# Patient Record
Sex: Male | Born: 1975 | Race: White | Hispanic: No | Smoking: Former smoker
Health system: Southern US, Community
[De-identification: ages and names within clinical notes are randomized; demographics above are authoritative.]

## PROBLEM LIST (undated history)

## (undated) DIAGNOSIS — F988 Other specified behavioral and emotional disorders with onset usually occurring in childhood and adolescence: Secondary | ICD-10-CM

## (undated) DIAGNOSIS — M1990 Unspecified osteoarthritis, unspecified site: Secondary | ICD-10-CM

## (undated) DIAGNOSIS — G4730 Sleep apnea, unspecified: Secondary | ICD-10-CM

## (undated) DIAGNOSIS — M549 Dorsalgia, unspecified: Secondary | ICD-10-CM

## (undated) DIAGNOSIS — E739 Lactose intolerance, unspecified: Secondary | ICD-10-CM

## (undated) DIAGNOSIS — T7840XA Allergy, unspecified, initial encounter: Secondary | ICD-10-CM

## (undated) DIAGNOSIS — R7303 Prediabetes: Secondary | ICD-10-CM

## (undated) DIAGNOSIS — M2550 Pain in unspecified joint: Secondary | ICD-10-CM

## (undated) DIAGNOSIS — R002 Palpitations: Secondary | ICD-10-CM

## (undated) DIAGNOSIS — K5900 Constipation, unspecified: Secondary | ICD-10-CM

## (undated) DIAGNOSIS — R112 Nausea with vomiting, unspecified: Secondary | ICD-10-CM

## (undated) DIAGNOSIS — K760 Fatty (change of) liver, not elsewhere classified: Secondary | ICD-10-CM

## (undated) DIAGNOSIS — K219 Gastro-esophageal reflux disease without esophagitis: Secondary | ICD-10-CM

## (undated) DIAGNOSIS — Z9889 Other specified postprocedural states: Secondary | ICD-10-CM

## (undated) DIAGNOSIS — M5136 Other intervertebral disc degeneration, lumbar region: Secondary | ICD-10-CM

## (undated) DIAGNOSIS — M51369 Other intervertebral disc degeneration, lumbar region without mention of lumbar back pain or lower extremity pain: Secondary | ICD-10-CM

## (undated) HISTORY — DX: Gastro-esophageal reflux disease without esophagitis: K21.9

## (undated) HISTORY — DX: Fatty (change of) liver, not elsewhere classified: K76.0

## (undated) HISTORY — DX: Other intervertebral disc degeneration, lumbar region without mention of lumbar back pain or lower extremity pain: M51.369

## (undated) HISTORY — DX: Unspecified osteoarthritis, unspecified site: M19.90

## (undated) HISTORY — PX: WISDOM TOOTH EXTRACTION: SHX21

## (undated) HISTORY — DX: Lactose intolerance, unspecified: E73.9

## (undated) HISTORY — DX: Other intervertebral disc degeneration, lumbar region: M51.36

## (undated) HISTORY — DX: Other specified behavioral and emotional disorders with onset usually occurring in childhood and adolescence: F98.8

## (undated) HISTORY — DX: Allergy, unspecified, initial encounter: T78.40XA

## (undated) HISTORY — PX: TONSILLECTOMY: SUR1361

## (undated) HISTORY — DX: Sleep apnea, unspecified: G47.30

## (undated) HISTORY — PX: ROTATOR CUFF REPAIR: SHX139

## (undated) HISTORY — DX: Palpitations: R00.2

## (undated) HISTORY — DX: Dorsalgia, unspecified: M54.9

## (undated) HISTORY — DX: Prediabetes: R73.03

## (undated) HISTORY — DX: Pain in unspecified joint: M25.50

## (undated) HISTORY — DX: Constipation, unspecified: K59.00

---

## 2008-03-12 HISTORY — PX: ROTATOR CUFF REPAIR: SHX139

## 2013-10-26 DIAGNOSIS — M545 Low back pain, unspecified: Secondary | ICD-10-CM | POA: Insufficient documentation

## 2015-10-07 ENCOUNTER — Encounter: Payer: Self-pay | Admitting: *Deleted

## 2015-10-07 ENCOUNTER — Emergency Department
Admission: EM | Admit: 2015-10-07 | Discharge: 2015-10-07 | Disposition: A | Discharge status: Home / Self Care | Payer: Self-pay | Source: Home / Self Care | Attending: Family Medicine | Admitting: Family Medicine

## 2015-10-07 DIAGNOSIS — S39012A Strain of muscle, fascia and tendon of lower back, initial encounter: Secondary | ICD-10-CM

## 2015-10-07 MED ORDER — IBUPROFEN 600 MG PO TABS
600.0000 mg | ORAL_TABLET | Freq: Once | ORAL | Status: AC
  Administered 2015-10-07: 600 mg via ORAL

## 2015-10-07 MED ORDER — MELOXICAM 7.5 MG PO TABS: 7.5000 mg | ORAL_TABLET | Freq: Every day | ORAL | 0 refills | Status: DC

## 2015-10-07 MED ORDER — CYCLOBENZAPRINE HCL 10 MG PO TABS: 10.0000 mg | ORAL_TABLET | Freq: Two times a day (BID) | ORAL | 0 refills | Status: DC | PRN

## 2015-10-07 NOTE — ED Provider Notes (Signed)
CSN: [id number]     Arrival date & time 10/07/15  1225 History   None    Chief Complaint  Patient presents with  . Back Pain   (Consider location/radiation/quality/duration/timing/severity/associated sxs/prior Treatment) HPI Douglas Fenstermaker Sr. is a 40 y.o. male presenting to UC with c/o lower back pain that has been persistent for about 2 days. Pt notes it started after he awkwardly stepped out of his truck, twisting to catch his phone that had fallen out of his lap. Pain is aching and sore, 4/10. Worse with certain movements and positions. He has been taking acetaminophen and did take 1 of his mother's flexerils last night with significant relief. Hx of back strain in the past. Pain does not radiate into legs. Denies numbness or tingling in legs or groin. Denies urinary symptoms. Denies fever or chills.    History reviewed. No pertinent past medical history. History reviewed. No pertinent surgical history. Family History  Problem Relation Age of Onset  . Hyperlipidemia Mother   . Hypertension Mother   . Diabetes Mother   . Hypertension Father   . Hyperlipidemia Father   . Diabetes Father    Social History  Substance Use Topics  . Smoking status: Former Games developer  . Smokeless tobacco: Never Used  . Alcohol use No    Review of Systems  Constitutional: Negative for chills and fever.  Gastrointestinal: Negative for nausea and vomiting.  Genitourinary: Negative for dysuria, flank pain, frequency and hematuria.  Musculoskeletal: Positive for back pain and myalgias. Negative for arthralgias, gait problem and joint swelling.  Skin: Negative for color change, rash and wound.  Neurological: Negative for weakness and numbness.    Allergies  Review of patient's allergies indicates no known allergies.  Home Medications   Prior to Admission medications   Medication Sig Start Date End Date Taking? Authorizing Provider  cyclobenzaprine (FLEXERIL) 10 MG tablet Take 1 tablet (10 mg total)  by mouth 2 (two) times daily as needed for muscle spasms. 10/07/15   Junius Finner, PA-C  meloxicam (MOBIC) 7.5 MG tablet Take 1-2 tablets (7.5-15 mg total) by mouth daily. For 5-7 days, then daily as needed for pain 10/07/15   Junius Finner, PA-C   Meds Ordered and Administered this Visit   Medications  ibuprofen (ADVIL,MOTRIN) tablet 600 mg (600 mg Oral Given 10/07/15 1252)    BP 136/86 (BP Location: Left Arm)   Pulse (!) 59   Temp 98.3 F (36.8 C) (Oral)   Ht 5\' 11"  (1.803 m)   Wt (!) 365 lb (165.6 kg)   SpO2 96%   BMI 50.91 kg/m  No data found.   Physical Exam  Constitutional: He is oriented to person, place, and time. He appears well-developed and well-nourished.  Sitting comfortably in exam chair, NAD  HENT:  Head: Normocephalic and atraumatic.  Neck: Normal range of motion. Neck supple.  No midline spinal tenderness.  Cardiovascular: Normal rate.   Pulmonary/Chest: Effort normal.  Musculoskeletal: Normal range of motion. He exhibits tenderness. He exhibits no edema or deformity.  No midline spinal tenderness. Tenderness to bilateral lower lumbar muscles.  Full ROM upper and lower extremities with 5/5 strength bilaterally.  Negative straight leg raise.  Neurological: He is alert and oriented to person, place, and time.  Skin: Skin is warm and dry. No rash noted. No erythema.  Psychiatric: He has a normal mood and affect. His behavior is normal.  Nursing note and vitals reviewed.   Urgent Care Course   Clinical Course  Procedures (including critical care time)  Labs Review Labs Reviewed - No data to display  Imaging Review No results found.   MDM   1. Low back strain, initial encounter    Pt c/o lower back pain from twisting type injury.  No red flag symptoms. No indication for imaging at this time.   Rx: flexeril and meloxicam  Home care instructions provided. Encouraged f/u with PCP or Sports Medicine in 1-2 weeks if not improving. Patient verbalized  understanding and agreement with treatment plan. Pt unsure if he wants to go back to work tomorrow as he wants to work but does not want to exacerbate pain. Pt is off on Sunday. Two work notes provided, one stating pt can go back tomorrow, 10/08/15, the other stating he can go back Monday, 10/10/15.    Junius Finner, PA-C 10/07/15 1340

## 2015-10-07 NOTE — Discharge Instructions (Signed)
°  Meloxicam (Mobic) is an antiinflammatory to help with pain and inflammation.  Do not take ibuprofen, Advil, Aleve, or any other medications that contain NSAIDs while taking meloxicam as this may cause stomach upset or even ulcers if taken in large amounts for an extended period of time.   Flexeril is a muscle relaxer and may cause drowsiness. Do not drink alcohol, drive, or operate heavy machinery while taking.  Please review information packets at the end of your paperwork for stretches and exercises to help relief and prevent your back pain.

## 2015-10-07 NOTE — ED Triage Notes (Signed)
Pt reports feeling a pull in his lower back when stepping our of his truck 2 days ago. He c/o low back pain ever since, radiates down back of his legs at times. Used warm compress @ home and 1 muscle relaxer with relief.

## 2018-08-21 ENCOUNTER — Telehealth: Payer: Self-pay | Admitting: *Deleted

## 2018-09-09 NOTE — Telephone Encounter (Signed)
Unable to reach pt

## 2018-12-01 ENCOUNTER — Other Ambulatory Visit: Payer: Self-pay

## 2018-12-01 ENCOUNTER — Telehealth: Payer: Self-pay | Admitting: Family Medicine

## 2018-12-02 ENCOUNTER — Encounter: Payer: Self-pay | Admitting: Family Medicine

## 2018-12-02 ENCOUNTER — Ambulatory Visit (INDEPENDENT_AMBULATORY_CARE_PROVIDER_SITE_OTHER): Payer: Self-pay

## 2018-12-02 ENCOUNTER — Ambulatory Visit (INDEPENDENT_AMBULATORY_CARE_PROVIDER_SITE_OTHER): Payer: Self-pay | Admitting: Family Medicine

## 2018-12-02 VITALS — BP 116/63 | HR 56 | Temp 98.7°F | Resp 20 | Ht 71.0 in | Wt >= 6400 oz

## 2018-12-02 DIAGNOSIS — M25562 Pain in left knee: Secondary | ICD-10-CM

## 2018-12-02 DIAGNOSIS — R002 Palpitations: Secondary | ICD-10-CM

## 2018-12-02 DIAGNOSIS — M549 Dorsalgia, unspecified: Secondary | ICD-10-CM

## 2018-12-02 DIAGNOSIS — R350 Frequency of micturition: Secondary | ICD-10-CM

## 2018-12-02 DIAGNOSIS — M25551 Pain in right hip: Secondary | ICD-10-CM

## 2018-12-02 DIAGNOSIS — M25552 Pain in left hip: Secondary | ICD-10-CM

## 2018-12-02 DIAGNOSIS — M25561 Pain in right knee: Secondary | ICD-10-CM

## 2018-12-02 DIAGNOSIS — R5383 Other fatigue: Secondary | ICD-10-CM

## 2018-12-02 DIAGNOSIS — R001 Bradycardia, unspecified: Secondary | ICD-10-CM

## 2018-12-02 DIAGNOSIS — Z0000 Encounter for general adult medical examination without abnormal findings: Secondary | ICD-10-CM

## 2018-12-02 LAB — BAYER DCA HB A1C WAIVED: HB A1C (BAYER DCA - WAIVED): 5.6 % (ref <7.0)

## 2018-12-02 NOTE — Patient Instructions (Signed)
For your weight lifting workout I recommend L-Arginine and/or conjugated linoleic Acid (CLA)

## 2018-12-02 NOTE — Progress Notes (Signed)
Subjective:  Patient ID: Douglas Poche Sr., male    DOB: [birth year]  Age: 43 y.o. MRN: [id number]  CC: Establish Care (NEW pt )   HPI Douglas Wegner Sr. presents for concerns for diabetes due to strong family history and feeling lethargic as well as having urinary frequency. He is obese and wants to know if there is a medical cause because he has been on many diets and only gained more weight. He admits having a bad sweet tooth.  . The patient has a concern for hypothyroidism.  Pt. denies any change in  voice, loss of hair, but has cold intolerance. Energy level has been poor. Patient denies constipation and diarrhea. No myxedema.   Additionally pt. Reports moderately severe pain in the lower back, hips and knees. He has had cortisone injections in the past with fair to good relief. AThe pain is increased with activity. The more strenuous the worse it gets.   HE has palptitations reported as heart skipping beats then trying to catch up by going faster.  Depression screen [facility] 2/9 12/02/2018  Decreased Interest 0  Down, Depressed, Hopeless 0  PHQ - 2 Score 0    History Douglas Branch has a past medical history of Allergy, Arthritis, DDD (degenerative disc disease), lumbar, GERD (gastroesophageal reflux disease), and Sleep apnea.   He has a past surgical history that includes Rotator cuff repair (Left); Tonsillectomy; and Wisdom tooth extraction.   His family history includes Diabetes in his father, maternal grandfather, maternal grandmother, mother, paternal grandfather, and paternal grandmother; Emphysema in his paternal grandmother; Gout in his paternal uncle; Hyperlipidemia in his father, maternal grandfather, maternal grandmother, mother, paternal grandfather, and paternal grandmother; Hypertension in his father, maternal grandfather, maternal grandmother, mother, paternal grandfather, and paternal grandmother.He reports that he has quit smoking. He has never used smokeless tobacco. He  reports that he does not drink alcohol or use drugs.    ROS Review of Systems  Constitutional: Negative.  Negative for activity change, fatigue and unexpected weight change.  HENT: Negative.  Negative for congestion, ear pain, hearing loss, postnasal drip and trouble swallowing.   Eyes: Negative for pain and visual disturbance.  Respiratory: Negative for cough, chest tightness and shortness of breath.   Cardiovascular: Negative for chest pain, palpitations and leg swelling.  Gastrointestinal: Negative for abdominal distention, abdominal pain, blood in stool, constipation, diarrhea, nausea and vomiting.  Endocrine: Negative for cold intolerance, heat intolerance and polydipsia.  Genitourinary: Positive for difficulty urinating, frequency, scrotal swelling, testicular pain and urgency. Negative for dysuria and flank pain.  Musculoskeletal: Positive for arthralgias and back pain. Negative for joint swelling and myalgias.  Skin: Negative for color change, rash and wound.  Neurological: Negative for dizziness, syncope, speech difficulty, weakness, light-headedness, numbness and headaches.  Hematological: Does not bruise/bleed easily.  Psychiatric/Behavioral: Negative for confusion, decreased concentration, dysphoric mood and sleep disturbance. The patient is not nervous/anxious.     Objective:  BP 116/63    Pulse (!) 56    Temp 98.7 F (37.1 C)    Resp 20    Ht _0  (1.803 m)    Wt (!) 406 lb (184.2 kg)    SpO2 96%    BMI 56.63 kg/m   BP Readings from Last 3 Encounters:  12/02/18 116/63  10/07/15 136/86    Wt Readings from Last 3 Encounters:  12/02/18 (!) 406 lb (184.2 kg)  10/07/15 (!) 365 lb (165.6 kg)     Physical Exam Constitutional:  General: He is not in acute distress.    Appearance: He is well-developed. He is obese.  HENT:     Head: Normocephalic and atraumatic.     Right Ear: External ear normal.     Left Ear: External ear normal.     Nose: Nose normal.  Eyes:      Conjunctiva/sclera: Conjunctivae normal.     Pupils: Pupils are equal, round, and reactive to light.  Neck:     Musculoskeletal: Normal range of motion and neck supple.     Thyroid: No thyromegaly.     Trachea: No tracheal deviation.  Cardiovascular:     Rate and Rhythm: Normal rate and regular rhythm.     Heart sounds: Normal heart sounds. No murmur. No friction rub. No gallop.   Pulmonary:     Effort: Pulmonary effort is normal. No respiratory distress.     Breath sounds: Normal breath sounds. No wheezing or rales.  Abdominal:     General: Bowel sounds are normal. There is no distension.     Palpations: Abdomen is soft. There is no mass.     Tenderness: There is no abdominal tenderness.     Hernia: There is no hernia in the left inguinal area.  Genitourinary:    Penis: Normal.      Scrotum/Testes: Normal.  Musculoskeletal: Normal range of motion.  Lymphadenopathy:     Cervical: No cervical adenopathy.  Skin:    General: Skin is warm and dry.  Neurological:     Mental Status: He is alert and oriented to person, place, and time.     Deep Tendon Reflexes: Reflexes are normal and symmetric.  Psychiatric:        Behavior: Behavior normal.        Thought Content: Thought content normal.        Judgment: Judgment normal.       Assessment & Plan:   Douglas Branch was seen today for establish care.  Diagnoses and all orders for this visit:  Palpitations -     Bayer DCA Hb A1c Waived -     CBC with Differential/Platelet -     CMP14+EGFR -     EKG 12-Lead -     Lipid panel -     TSH + free T4 -     Urinalysis, Complete -     Urine Culture -     VITAMIN D 25 Hydroxy (Vit-D Deficiency, Fractures) -     Testosterone,Free and Total -     Cortisol  Frequent urination -     Bayer DCA Hb A1c Waived -     CBC with Differential/Platelet -     CMP14+EGFR -     Urinalysis, Complete -     Urine Culture -     VITAMIN D 25 Hydroxy (Vit-D Deficiency, Fractures) -      Testosterone,Free and Total -     Cortisol  Fatigue, unspecified type -     Bayer DCA Hb A1c Waived -     CBC with Differential/Platelet -     CMP14+EGFR -     Lipid panel -     TSH + free T4 -     VITAMIN D 25 Hydroxy (Vit-D Deficiency, Fractures) -     Testosterone,Free and Total -     Cortisol -     CYCLIC CITRUL PEPTIDE ANTIBODY, IGG/IGA -     Arthritis Panel -     Anti-DNA antibody, double-stranded -  HLA-B27 antigen -     Sedimentation rate  Well adult exam -     CBC with Differential/Platelet -     CMP14+EGFR -     Lipid panel -     VITAMIN D 25 Hydroxy (Vit-D Deficiency, Fractures) -     Testosterone,Free and Total -     Cortisol  Morbid obesity (HCC) -     Bayer DCA Hb A1c Waived -     CBC with Differential/Platelet -     CMP14+EGFR -     TSH + free T4 -     VITAMIN D 25 Hydroxy (Vit-D Deficiency, Fractures) -     Testosterone,Free and Total -     Cortisol -     CYCLIC CITRUL PEPTIDE ANTIBODY, IGG/IGA -     Arthritis Panel -     Anti-DNA antibody, double-stranded -     HLA-B27 antigen -     Sedimentation rate  Arthralgia of back -     CBC with Differential/Platelet -     CMP14+EGFR -     VITAMIN D 25 Hydroxy (Vit-D Deficiency, Fractures) -     DG Lumbar Spine 2-3 Views; Future -     Testosterone,Free and Total -     Cortisol -     CYCLIC CITRUL PEPTIDE ANTIBODY, IGG/IGA -     Arthritis Panel -     Anti-DNA antibody, double-stranded -     HLA-B27 antigen -     Sedimentation rate -     Ambulatory referral to Orthopedics  Arthralgia of both knees -     CBC with Differential/Platelet -     CMP14+EGFR -     VITAMIN D 25 Hydroxy (Vit-D Deficiency, Fractures) -     Cancel: DG Knee 1-2 Views Right; Future -     Testosterone,Free and Total -     Cortisol -     CYCLIC CITRUL PEPTIDE ANTIBODY, IGG/IGA -     Arthritis Panel -     Anti-DNA antibody, double-stranded -     HLA-B27 antigen -     Sedimentation rate -     DG Knee 1-2 Views Left; Future -      Ambulatory referral to Orthopedics  Pain of both hip joints -     CBC with Differential/Platelet -     CMP14+EGFR -     VITAMIN D 25 Hydroxy (Vit-D Deficiency, Fractures) -     DG Pelvis 1-2 Views; Future -     Testosterone,Free and Total -     Cortisol -     CYCLIC CITRUL PEPTIDE ANTIBODY, IGG/IGA -     Arthritis Panel -     Anti-DNA antibody, double-stranded -     HLA-B27 antigen -     Sedimentation rate -     Ambulatory referral to Orthopedics  Bradycardia by electrocardiogram -     TSH + free T4 -     Ambulatory referral to Cardiology       I have discontinued Remo Lipps Sabree Sr.'s meloxicam and cyclobenzaprine. I am also having him maintain his multivitamin with minerals and glucosamine-chondroitin.  Allergies as of 12/02/2018      Reactions   Pollen Extract Itching   Watery eyes      Medication List       Accurate as of December 02, 2018  5:40 PM. If you have any questions, ask your nurse or doctor.        STOP taking these medications  cyclobenzaprine 10 MG tablet Commonly known as: FLEXERIL Stopped by: Claretta Fraise, MD   meloxicam 7.5 MG tablet Commonly known as: Mobic Stopped by: Claretta Fraise, MD     TAKE these medications   glucosamine-chondroitin 500-400 MG tablet Take 2 tablets by mouth daily.   multivitamin with minerals Tabs tablet Take 1 tablet by mouth daily.        Follow-up: Return in about 6 weeks (around 01/13/2019).  Claretta Fraise, M.D.

## 2018-12-03 ENCOUNTER — Other Ambulatory Visit: Payer: Self-pay

## 2018-12-04 ENCOUNTER — Telehealth: Payer: Self-pay | Admitting: Family Medicine

## 2018-12-05 LAB — CMP14+EGFR
ALT: 44 IU/L (ref 0–44)
AST: 59 IU/L — ABNORMAL HIGH (ref 0–40)
Albumin/Globulin Ratio: 1.3 (ref 1.2–2.2)
Albumin: 4 g/dL (ref 4.0–5.0)
Alkaline Phosphatase: 97 IU/L (ref 39–117)
BUN/Creatinine Ratio: 12 (ref 9–20)
BUN: 11 mg/dL (ref 6–24)
Bilirubin Total: 0.8 mg/dL (ref 0.0–1.2)
CO2: 24 mmol/L (ref 20–29)
Calcium: 9.3 mg/dL (ref 8.7–10.2)
Chloride: 103 mmol/L (ref 96–106)
Creatinine, Ser: 0.9 mg/dL (ref 0.76–1.27)
GFR calc Af Amer: 121 mL/min/{1.73_m2} (ref >59)
GFR calc non Af Amer: 104 mL/min/{1.73_m2} (ref >59)
Globulin, Total: 3.2 g/dL (ref 1.5–4.5)
Glucose: 98 mg/dL (ref 65–99)
Potassium: 4.3 mmol/L (ref 3.5–5.2)
Sodium: 140 mmol/L (ref 134–144)
Total Protein: 7.2 g/dL (ref 6.0–8.5)

## 2018-12-05 LAB — CBC WITH DIFFERENTIAL/PLATELET
Basophils Absolute: 0 10*3/uL (ref 0.0–0.2)
Basos: 0 %
EOS (ABSOLUTE): 0.2 10*3/uL (ref 0.0–0.4)
Eos: 3 %
Hematocrit: 40.2 % (ref 37.5–51.0)
Hemoglobin: 13.9 g/dL (ref 13.0–17.7)
Immature Grans (Abs): 0 10*3/uL (ref 0.0–0.1)
Immature Granulocytes: 0 %
Lymphocytes Absolute: 1.7 10*3/uL (ref 0.7–3.1)
Lymphs: 31 %
MCH: 32.6 pg (ref 26.6–33.0)
MCHC: 34.6 g/dL (ref 31.5–35.7)
MCV: 94 fL (ref 79–97)
Monocytes Absolute: 0.6 10*3/uL (ref 0.1–0.9)
Monocytes: 11 %
Neutrophils Absolute: 3 10*3/uL (ref 1.4–7.0)
Neutrophils: 55 %
Platelets: 107 10*3/uL — ABNORMAL LOW (ref 150–450)
RBC: 4.26 x10E6/uL (ref 4.14–5.80)
RDW: 12.9 % (ref 11.6–15.4)
WBC: 5.4 10*3/uL (ref 3.4–10.8)

## 2018-12-05 LAB — TESTOSTERONE,FREE AND TOTAL
Testosterone, Free: 9 pg/mL (ref 6.8–21.5)
Testosterone: 732 ng/dL (ref 264–916)

## 2018-12-05 LAB — TSH+FREE T4
Free T4: 1.17 ng/dL (ref 0.82–1.77)
TSH: 1.52 u[IU]/mL (ref 0.450–4.500)

## 2018-12-05 LAB — VITAMIN D 25 HYDROXY (VIT D DEFICIENCY, FRACTURES): Vit D, 25-Hydroxy: 19.8 ng/mL — ABNORMAL LOW (ref 30.0–100.0)

## 2018-12-05 LAB — LIPID PANEL
Chol/HDL Ratio: 3.2 ratio (ref 0.0–5.0)
Cholesterol, Total: 224 mg/dL — ABNORMAL HIGH (ref 100–199)
HDL: 71 mg/dL (ref >39)
LDL Chol Calc (NIH): 134 mg/dL — ABNORMAL HIGH (ref 0–99)
Triglycerides: 107 mg/dL (ref 0–149)
VLDL Cholesterol Cal: 19 mg/dL (ref 5–40)

## 2018-12-05 LAB — CORTISOL: Cortisol: 13.2 ug/dL

## 2018-12-09 LAB — ARTHRITIS PANEL
Basophils Absolute: 0 10*3/uL (ref 0.0–0.2)
Basos: 0 %
EOS (ABSOLUTE): 0.2 10*3/uL (ref 0.0–0.4)
Eos: 3 %
Hematocrit: 40.2 % (ref 37.5–51.0)
Hemoglobin: 13.9 g/dL (ref 13.0–17.7)
Immature Grans (Abs): 0 10*3/uL (ref 0.0–0.1)
Immature Granulocytes: 0 %
Lymphocytes Absolute: 1.7 10*3/uL (ref 0.7–3.1)
Lymphs: 30 %
MCH: 32.8 pg (ref 26.6–33.0)
MCHC: 34.6 g/dL (ref 31.5–35.7)
MCV: 95 fL (ref 79–97)
Monocytes Absolute: 0.6 10*3/uL (ref 0.1–0.9)
Monocytes: 11 %
Neutrophils Absolute: 3.1 10*3/uL (ref 1.4–7.0)
Neutrophils: 56 %
Platelets: 103 10*3/uL — ABNORMAL LOW (ref 150–450)
RBC: 4.24 x10E6/uL (ref 4.14–5.80)
RDW: 12.9 % (ref 11.6–15.4)
Rhuematoid fact SerPl-aCnc: <10 IU/mL (ref 0.0–13.9)
Sed Rate: 24 mm/hr — ABNORMAL HIGH (ref 0–15)
Uric Acid: 4.8 mg/dL (ref 3.7–8.6)
WBC: 5.6 10*3/uL (ref 3.4–10.8)

## 2018-12-09 LAB — HLA-B27 ANTIGEN: HLA B27: NEGATIVE

## 2018-12-09 LAB — ANTI-DNA ANTIBODY, DOUBLE-STRANDED: dsDNA Ab: 2 IU/mL (ref 0–9)

## 2018-12-09 LAB — CYCLIC CITRUL PEPTIDE ANTIBODY, IGG/IGA: Cyclic Citrullin Peptide Ab: 17 units (ref 0–19)

## 2018-12-15 ENCOUNTER — Other Ambulatory Visit: Payer: Self-pay | Admitting: *Deleted

## 2018-12-15 ENCOUNTER — Telehealth: Payer: Self-pay | Admitting: Family Medicine

## 2018-12-15 NOTE — Progress Notes (Signed)
Left message to please call our office. 

## 2018-12-15 NOTE — Telephone Encounter (Signed)
Message left on voice mail.  Labs are alright but needs script of vitamin D.

## 2018-12-17 ENCOUNTER — Ambulatory Visit: Payer: Self-pay | Admitting: Orthopaedic Surgery

## 2018-12-18 ENCOUNTER — Encounter: Payer: Self-pay | Admitting: *Deleted

## 2018-12-18 ENCOUNTER — Telehealth: Payer: Self-pay | Admitting: Family Medicine

## 2018-12-18 MED ORDER — VITAMIN D (ERGOCALCIFEROL) 1.25 MG (50000 UNIT) PO CAPS: 50000.0000 [IU] | ORAL_CAPSULE | ORAL | 1 refills | Status: DC

## 2018-12-18 NOTE — Telephone Encounter (Signed)
-----   Message from Claretta Fraise, MD sent at 12/14/2018 12:01 AM EDT ----- Dear Douglas Branch,     Your Vitamin D is  low. You need a prescription strength supplement of 50,000 units weekly    Nurse : Please send in scrip for Vit. D 50K units, 1 weekly #13, 1 refill Thanks, WS

## 2018-12-18 NOTE — Telephone Encounter (Signed)
Rx sent to [facility].

## 2018-12-22 ENCOUNTER — Telehealth: Payer: Self-pay | Admitting: Family Medicine

## 2018-12-23 NOTE — Telephone Encounter (Signed)
Notes was fax to [facility]

## 2018-12-30 ENCOUNTER — Ambulatory Visit: Payer: Self-pay | Admitting: Cardiovascular Disease

## 2019-01-13 ENCOUNTER — Ambulatory Visit: Payer: Self-pay | Admitting: Family Medicine

## 2019-01-19 ENCOUNTER — Encounter: Payer: Self-pay | Admitting: Family Medicine

## 2019-08-19 ENCOUNTER — Telehealth: Payer: Self-pay | Admitting: *Deleted

## 2019-08-19 NOTE — Telephone Encounter (Signed)
He had an episode at work feeling faint, dizzy, nausea, weak and left arm numbness.  Advised to go to emergency for treatment and call to schedule follow up with provider.

## 2019-08-20 ENCOUNTER — Telehealth: Payer: Self-pay | Admitting: Family Medicine

## 2019-08-20 NOTE — Telephone Encounter (Signed)
Pt went to urgent care yesterday per our office and was advised to contact pcp for a rx for diabetic supplies. Pt did setup at with [facility] for 09/08/2019 Pt Use [facility].

## 2019-08-20 NOTE — Telephone Encounter (Signed)
Waiting on copy of lab results from [facility].  Patient may get copy from them to bring to appointment with provider.

## 2019-08-26 DIAGNOSIS — D696 Thrombocytopenia, unspecified: Secondary | ICD-10-CM | POA: Insufficient documentation

## 2019-08-27 ENCOUNTER — Telehealth: Payer: Self-pay | Admitting: Family Medicine

## 2019-08-27 NOTE — Telephone Encounter (Signed)
No answer, no voicemail.

## 2019-09-08 ENCOUNTER — Ambulatory Visit: Payer: Self-pay | Admitting: Family Medicine

## 2019-09-09 DIAGNOSIS — D732 Chronic congestive splenomegaly: Secondary | ICD-10-CM | POA: Insufficient documentation

## 2019-09-09 DIAGNOSIS — K7469 Other cirrhosis of liver: Secondary | ICD-10-CM | POA: Insufficient documentation

## 2019-12-16 ENCOUNTER — Ambulatory Visit (INDEPENDENT_AMBULATORY_CARE_PROVIDER_SITE_OTHER): Payer: Managed Care, Other (non HMO) | Admitting: Orthopaedic Surgery

## 2019-12-16 ENCOUNTER — Encounter: Payer: Self-pay | Admitting: Orthopaedic Surgery

## 2019-12-16 DIAGNOSIS — M160 Bilateral primary osteoarthritis of hip: Secondary | ICD-10-CM

## 2019-12-16 NOTE — Progress Notes (Signed)
Office Visit Note   Patient: Douglas Vancuren Sr.           Date of Birth: [birth year]           MRN: [id number] Visit Date: 12/16/2019              Requested by: Mechele Claude, MD [address] [place],  [place] [address] PCP: Mechele Claude, MD   Assessment & Plan: Visit Diagnoses:  1. Primary osteoarthritis of both hips   2. Morbid (severe) obesity due to excess calories [facility])     Plan: Mr. Tourigny has advanced osteoarthritis of both hips.  Long discussion regarding his arthritis and treatment options over time including hip replacement.  His present BMI is 50.  He has lost nearly 60 pounds but needs to lose at least another 60 to have a BMI of somewhere around 3018.  The meantime I will order cortisone injections of both of his hips.  Follow-Up Instructions: Return After cortisone injection both hips.   Orders:  No orders of the defined types were placed in this encounter.  No orders of the defined types were placed in this encounter.     Procedures: No procedures performed   Clinical Data: No additional findings.   Subjective: Chief Complaint  Patient presents with  . Left Hip - Pain  . Right Hip - Pain  Patient presents today for bilateral hip pain. he states that they have been hurting since 2002. His hip pain is getting worse. The pain is located all throughout his hips and he states that it wraps around. The pain gets worse with any prolonged standing or sitting. No previous hip surgery. He has taken Tylenol but states that it does not help. He states that he has found only one thing that helps the pain and he would not disclose that information. He had bilateral hip x-rays done at [facility].  Has had prior cortisone injection in his hips with relief.  Family history is positive for hip arthritis as his father recently had hip replacement surgery.  Relates that his greatest weight was 400 pounds and presently is at 342. I did review films of his hips  on the PACS system.  He has essentially end-stage osteoarthritis bilaterally.  There are peripheral osteophytes and narrowing of the hip joint.  He has short femoral necks  HPI  Review of Systems   Objective: Vital Signs: Ht 5\' 9"  (1.753 m)   Wt (!) 341 lb (154.7 kg)   BMI 50.36 kg/m   Physical Exam Constitutional:      Appearance: He is well-developed.  Eyes:     Pupils: Pupils are equal, round, and reactive to light.  Pulmonary:     Effort: Pulmonary effort is normal.  Skin:    General: Skin is warm and dry.  Neurological:     Mental Status: He is alert and oriented to person, place, and time.  Psychiatric:        Behavior: Behavior normal.     Ortho Exam awake alert and oriented x3.  Comfortable sitting.  Large man.  Does have significant loss of motion of both of his hips with only about 5 or 10 degrees of internal and external rotation from a neutral position.  Does have a walker gait related to his arthritis.  Very large thighs.  Motor exam intact.  No percussible tenderness of lumbar spine  Specialty Comments:  No specialty comments available.  Imaging: No results found.  PMFS History: Patient Active Problem List   Diagnosis Date Noted  . Osteoarthritis of both hips 12/16/2019  . Morbid (severe) obesity due to excess calories (HCC) 12/16/2019   Past Medical History:  Diagnosis Date  . Allergy    seasonal   . Arthritis    hips, knee  . DDD (degenerative disc disease), lumbar   . GERD (gastroesophageal reflux disease)   . Sleep apnea     Family History  Problem Relation Age of Onset  . Hyperlipidemia Mother   . Hypertension Mother   . Diabetes Mother   . Hypertension Father   . Hyperlipidemia Father   . Diabetes Father   . Gout Paternal Uncle   . Diabetes Maternal Grandmother   . Hyperlipidemia Maternal Grandmother   . Hypertension Maternal Grandmother   . Diabetes Maternal Grandfather   . Hyperlipidemia Maternal Grandfather   . Hypertension  Maternal Grandfather   . Diabetes Paternal Grandmother   . Hyperlipidemia Paternal Grandmother   . Emphysema Paternal Grandmother   . Hypertension Paternal Grandmother   . Diabetes Paternal Grandfather   . Hyperlipidemia Paternal Grandfather   . Hypertension Paternal Grandfather     Past Surgical History:  Procedure Laterality Date  . ROTATOR CUFF REPAIR Left   . TONSILLECTOMY    . WISDOM TOOTH EXTRACTION     Social History   Occupational History  . Not on file  Tobacco Use  . Smoking status: Former Games developer  . Smokeless tobacco: Never Used  Vaping Use  . Vaping Use: Never used  Substance and Sexual Activity  . Alcohol use: No  . Drug use: No  . Sexual activity: Not on file

## 2020-01-06 ENCOUNTER — Other Ambulatory Visit: Payer: Self-pay

## 2020-01-06 DIAGNOSIS — M160 Bilateral primary osteoarthritis of hip: Secondary | ICD-10-CM

## 2020-01-19 ENCOUNTER — Telehealth: Payer: Self-pay | Admitting: *Deleted

## 2020-01-19 NOTE — Telephone Encounter (Signed)
-----   Message from Wendi Maya, RT sent at 01/18/2020  4:24 PM EST ----- Please help.  This patient has not been contacted about his bilateral hip injections at [facility]. The order was placed a couple weeks ago. Can you help? His contact number is [phone]. Thanks!

## 2020-01-19 NOTE — Telephone Encounter (Signed)
I contacted someone with scheduling to see if can help me get pt scheduled.

## 2020-02-24 ENCOUNTER — Encounter: Payer: Self-pay | Admitting: Family Medicine

## 2020-03-29 NOTE — Progress Notes (Signed)
Cardiology Office Note   Date:  03/30/2020   ID:  Douglas Jarvis Sr., DOB 1975/07/07, MRN [id number]  PCP:  Rollene Rotunda, MD  Cardiologist:   Prentice Docker, MD (Inactive) Referring:  Rollene Rotunda, MD  Chief Complaint  Patient presents with  . Palpitations      History of Present Illness: Douglas Goodpasture Sr. is a 45 y.o. male who is referred by Rollene Rotunda, MD for evaluation of palpitations.   Patient has a history of bradycardia.  He did have 1 episode of near syncope when he was under quite a bit of stress around the holidays but otherwise he says he does not have any near syncope or orthostasis or syncopal episodes.  He works a physical job and says he does not have trouble with this.  He does have palpitations.  He describes feeling like his heart is skipping and pauses for a second.  It is uncomfortable feeling but it does not associated with any near syncope.  He does not have chest pressure, neck or arm discomfort.  He does not have PND or orthopnea.  He has had some shortness of breath with exertion but this has been relatively stable.  He does have sleep apnea and he sleeps with CPAP.  He said he had a stress test years ago but does not have any recollection of why.  He does have a remarkable family history for early coronary artery disease.  His mother had her first heart attack in her 55s and bypass in her 83s.  I do not see a recent lipid profile but previously his LDL was normal at 34.  He has not been diabetic.  Past Medical History:  Diagnosis Date  . Allergy    seasonal   . Arthritis    hips, knee  . DDD (degenerative disc disease), lumbar   . GERD (gastroesophageal reflux disease)   . Sleep apnea     Past Surgical History:  Procedure Laterality Date  . ROTATOR CUFF REPAIR Left   . TONSILLECTOMY    . WISDOM TOOTH EXTRACTION       Current Outpatient Medications  Medication Sig Dispense Refill  . glucosamine-chondroitin 500-400 MG tablet Take 2  tablets by mouth daily.    . Multiple Vitamin (MULTIVITAMIN WITH MINERALS) TABS tablet Take 1 tablet by mouth daily.     No current facility-administered medications for this visit.    Allergies:   Pollen extract    Social History:  The patient  reports that he has quit smoking. He has never used smokeless tobacco. He reports that he does not drink alcohol and does not use drugs.   Family History:  The patient's family history includes Diabetes in his father, maternal grandfather, maternal grandmother, mother, paternal grandfather, and paternal grandmother; Emphysema in his paternal grandmother; Gout in his paternal uncle; Heart attack (age of onset: 43) in his mother; Hyperlipidemia in his father, maternal grandfather, maternal grandmother, mother, paternal grandfather, and paternal grandmother; Hypertension in his father, maternal grandfather, maternal grandmother, mother, paternal grandfather, and paternal grandmother.    ROS:  Please see the history of present illness.   Otherwise, review of systems are positive for none.   All other systems are reviewed and negative.    PHYSICAL EXAM: VS:  BP 128/80   Pulse (!) 50   Ht 5\' 9"  (1.753 m)   Wt (!) 333 lb 3.2 oz (151.1 kg)   SpO2 96%   BMI 49.21 kg/m  , BMI Body  mass index is 49.21 kg/m. GENERAL:  Well appearing HEENT:  Pupils equal round and reactive, fundi not visualized, oral mucosa unremarkable NECK:  No jugular venous distention, waveform within normal limits, carotid upstroke brisk and symmetric, no bruits, no thyromegaly LYMPHATICS:  No cervical, inguinal adenopathy LUNGS:  Clear to auscultation bilaterally BACK:  No CVA tenderness CHEST:  Unremarkable HEART:  PMI not displaced or sustained,S1 and S2 within normal limits, no S3, no S4, no clicks, no rubs, no murmurs ABD:  Flat, positive bowel sounds normal in frequency in pitch, no bruits, no rebound, no guarding, no midline pulsatile mass, no hepatomegaly, no  splenomegaly EXT:  2 plus pulses throughout, no edema, no cyanosis no clubbing SKIN:  No rashes no nodules NEURO:  Cranial nerves II through XII grossly intact, motor grossly intact throughout PSYCH:  Cognitively intact, oriented to person place and time    EKG:  EKG is ordered today. The ekg ordered today demonstrates sinus bradycardia, rate 52, axis within no acute ST-T wave.   Recent Labs: No results found for requested labs within last 8760 hours.    Lipid Panel    Component Value Date/Time   CHOL 224 (H) 12/03/2018 0810   TRIG 107 12/03/2018 0810   HDL 71 12/03/2018 0810   CHOLHDL 3.2 12/03/2018 0810   LDLCALC 134 (H) 12/03/2018 0810      Wt Readings from Last 3 Encounters:  03/30/20 (!) 333 lb 3.2 oz (151.1 kg)  12/16/19 (!) 341 lb (154.7 kg)  12/02/18 (!) 406 lb (184.2 kg)      Other studies Reviewed: Additional studies/ records that were reviewed today include: Labs. Review of the above records demonstrates:  Please see elsewhere in the note.     ASSESSMENT AND PLAN:  PALPITATIONS:    I will have him wear a 3-day Zio patch.  I did have him walk around the office.  He does have a resting low heart rate that went up to 75 with minimal exertion around the office.  I doubt that we will have to do anything further but this will be based on the results of the monitor.  I suspect he is having PACs.  SLEEP APNEA: He is about to have another sleep study as its been a while.  We talked about treatment of this as a management of bradycardia.  MORBID OBESITY: He has lost about 50 pounds by limiting carbohydrates.  I applauded his and encouraged more of the same.  DOE: He does have some dyspnea on exertion.  He has a significant family history.  I would like him to have a coronary calcium score and lipid profile.  Current medicines are reviewed at length with the patient today.  The patient does not have concerns regarding medicines.  The following changes have been  made:  No change  Labs/ tests ordered today include:   Orders Placed This Encounter  Procedures  . CT CARDIAC SCORING (SELF PAY ONLY)  . Lipid panel  . LONG TERM MONITOR (3-14 DAYS)  . EKG 12-Lead     Disposition:   FU with me as needed based on the results of the above   Signed, Rollene Rotunda, MD  03/30/2020 3:30 PM    [facility]

## 2020-03-30 ENCOUNTER — Other Ambulatory Visit: Payer: Self-pay

## 2020-03-30 ENCOUNTER — Encounter: Payer: Self-pay | Admitting: Cardiology

## 2020-03-30 ENCOUNTER — Ambulatory Visit (INDEPENDENT_AMBULATORY_CARE_PROVIDER_SITE_OTHER): Payer: Managed Care, Other (non HMO) | Admitting: Cardiology

## 2020-03-30 ENCOUNTER — Ambulatory Visit (INDEPENDENT_AMBULATORY_CARE_PROVIDER_SITE_OTHER): Payer: Managed Care, Other (non HMO)

## 2020-03-30 ENCOUNTER — Encounter: Payer: Self-pay | Admitting: *Deleted

## 2020-03-30 VITALS — BP 128/80 | HR 50 | Ht 69.0 in | Wt 333.2 lb

## 2020-03-30 DIAGNOSIS — Z0000 Encounter for general adult medical examination without abnormal findings: Secondary | ICD-10-CM | POA: Diagnosis not present

## 2020-03-30 DIAGNOSIS — G4730 Sleep apnea, unspecified: Secondary | ICD-10-CM

## 2020-03-30 DIAGNOSIS — R079 Chest pain, unspecified: Secondary | ICD-10-CM | POA: Diagnosis not present

## 2020-03-30 DIAGNOSIS — R002 Palpitations: Secondary | ICD-10-CM

## 2020-03-30 NOTE — Progress Notes (Signed)
Patient ID: Douglas Drees Sr., male   DOB: 1975-06-02, 45 y.o.   MRN: [id number] Patient enrolled for [employer] to ship a day ZIO XT long term holter monitor to his home.

## 2020-03-30 NOTE — Patient Instructions (Addendum)
Medication Instructions:  The current medical regimen is effective;  continue present plan and medications.  *If you need a refill on your cardiac medications before your next appointment, please call your pharmacy*  Lab Work: Please have blood work the same days as your Calcium Score.  If you have labs (blood work) drawn today and your tests are completely normal, you will receive your results only by: Marland Kitchen MyChart Message (if you have MyChart) OR . A paper copy in the mail If you have any lab test that is abnormal or we need to change your treatment, we will call you to review the results.  Testing/Procedures: Your physician has requested that you have Coronary Calcium score which is completed by CT. Cardiac computed tomography (CT) is a painless test that uses an x-ray machine to take clear, detailed pictures of your heart.  This testing is done at our [address], [address], [place], [place] office.  The cost is $99 and due at the time of the procedure.  ZIO XT- Long Term Monitor Instructions   Your physician has requested you wear your ZIO patch monitor 14 days.   This is a single patch monitor.  [employer] supplies one patch monitor per enrollment.  Additional stickers are not available.   Please do not apply patch if you will be having a Nuclear Stress Test, Echocardiogram, Cardiac CT, MRI, or Chest Xray during the time frame you would be wearing the monitor. The patch cannot be worn during these tests.  You cannot remove and re-apply the ZIO XT patch monitor.   Your ZIO patch monitor will be sent USPS Priority mail from [facility] directly to your home address. The monitor may also be mailed to a PO BOX if home delivery is not available.   It may take 3-5 days to receive your monitor after you have been enrolled.   Once you have received you monitor, please review enclosed instructions.  Your monitor has already been registered assigning a specific monitor serial # to  you.   Applying the monitor   Shave hair from upper left chest.   Hold abrader disc by orange tab.  Rub abrader in 40 strokes over left upper chest as indicated in your monitor instructions.   Clean area with 4 enclosed alcohol pads .  Use all pads to assure are is cleaned thoroughly.  Let dry.   Apply patch as indicated in monitor instructions.  Patch will be place under collarbone on left side of chest with arrow pointing upward.   Rub patch adhesive wings for 2 minutes.Remove white label marked "1".  Remove white label marked "2".  Rub patch adhesive wings for 2 additional minutes.   While looking in a mirror, press and release button in center of patch.  A small green light will flash 3-4 times .  This will be your only indicator the monitor has been turned on.     Do not shower for the first 24 hours.  You may shower after the first 24 hours.   Press button if you feel a symptom. You will hear a small click.  Record Date, Time and Symptom in the Patient Log Book.   When you are ready to remove patch, follow instructions on last 2 pages of Patient Log Book.  Stick patch monitor onto last page of Patient Log Book.   Place Patient Log Book in [place] box.  Use locking tab on box and tape box closed securely.  The  [employer] and [employer] has [employer] on it.  Please place in mailbox as soon as possible.  Your physician should have your test results approximately 7 days after the monitor has been mailed back to [facility].   Call [facility] Customer Care at [phone] if you have questions regarding your ZIO XT patch monitor.  Call them immediately if you see an orange light blinking on your monitor.   If your monitor falls off in less than 4 days contact our Monitor department at [phone].  If your monitor becomes loose or falls off after 4 days call [employer] at [phone] for suggestions on securing your monitor.    Follow-Up: At [facility], you and your health  needs are our priority.  As part of our continuing mission to provide you with exceptional heart care, we have created designated Provider Care Teams.  These Care Teams include your primary Cardiologist (physician) and Advanced Practice Providers (APPs -  Physician Assistants and Nurse Practitioners) who all work together to provide you with the care you need, when you need it.  We recommend signing up for the patient portal called "MyChart".  Sign up information is provided on this After Visit Summary.  MyChart is used to connect with patients for Virtual Visits (Telemedicine).  Patients are able to view lab/test results, encounter notes, upcoming appointments, etc.  Non-urgent messages can be sent to your provider as well.   To learn more about what you can do with MyChart, go to [email or web address].    Your next appointment:   Follow up will be based on the results of the above testing.   Thank you for choosing [facility]!!

## 2020-04-21 ENCOUNTER — Telehealth: Payer: Self-pay | Admitting: Cardiology

## 2020-04-21 NOTE — Telephone Encounter (Signed)
Douglas Grave, RN  [facility] [address] Scheduling; [facility] Pt was ordered to have a Ca score and lipid panel by Dr Antoine Poche. Can someone please call and schedule him for these 2 things on the same day so he only has to make one trip?   Thank you   Pam    04/08/20 [facility] to call and schedule CT score and Lab/saf  04-12-20 Lvmom to call and schedule CT Score and Labs/saf   04/18/20 LVMTCB to schedule CT and Lipid Panel same day per staff message - anc  04/21/20 LVMTCB to schedule CT and Lipid Panel same day per staff message - anc  Pt has been contacted 4x with no response. Closing staff message.

## 2020-10-23 IMAGING — DX DG KNEE 1-2V*L*
2 series · 2 of 2 positions shown · non-contrast
Comparison: No prior.

CLINICAL DATA: Left knee pain.  No known injury.

EXAM:
LEFT KNEE - 1-2 VIEW

[knee ap]
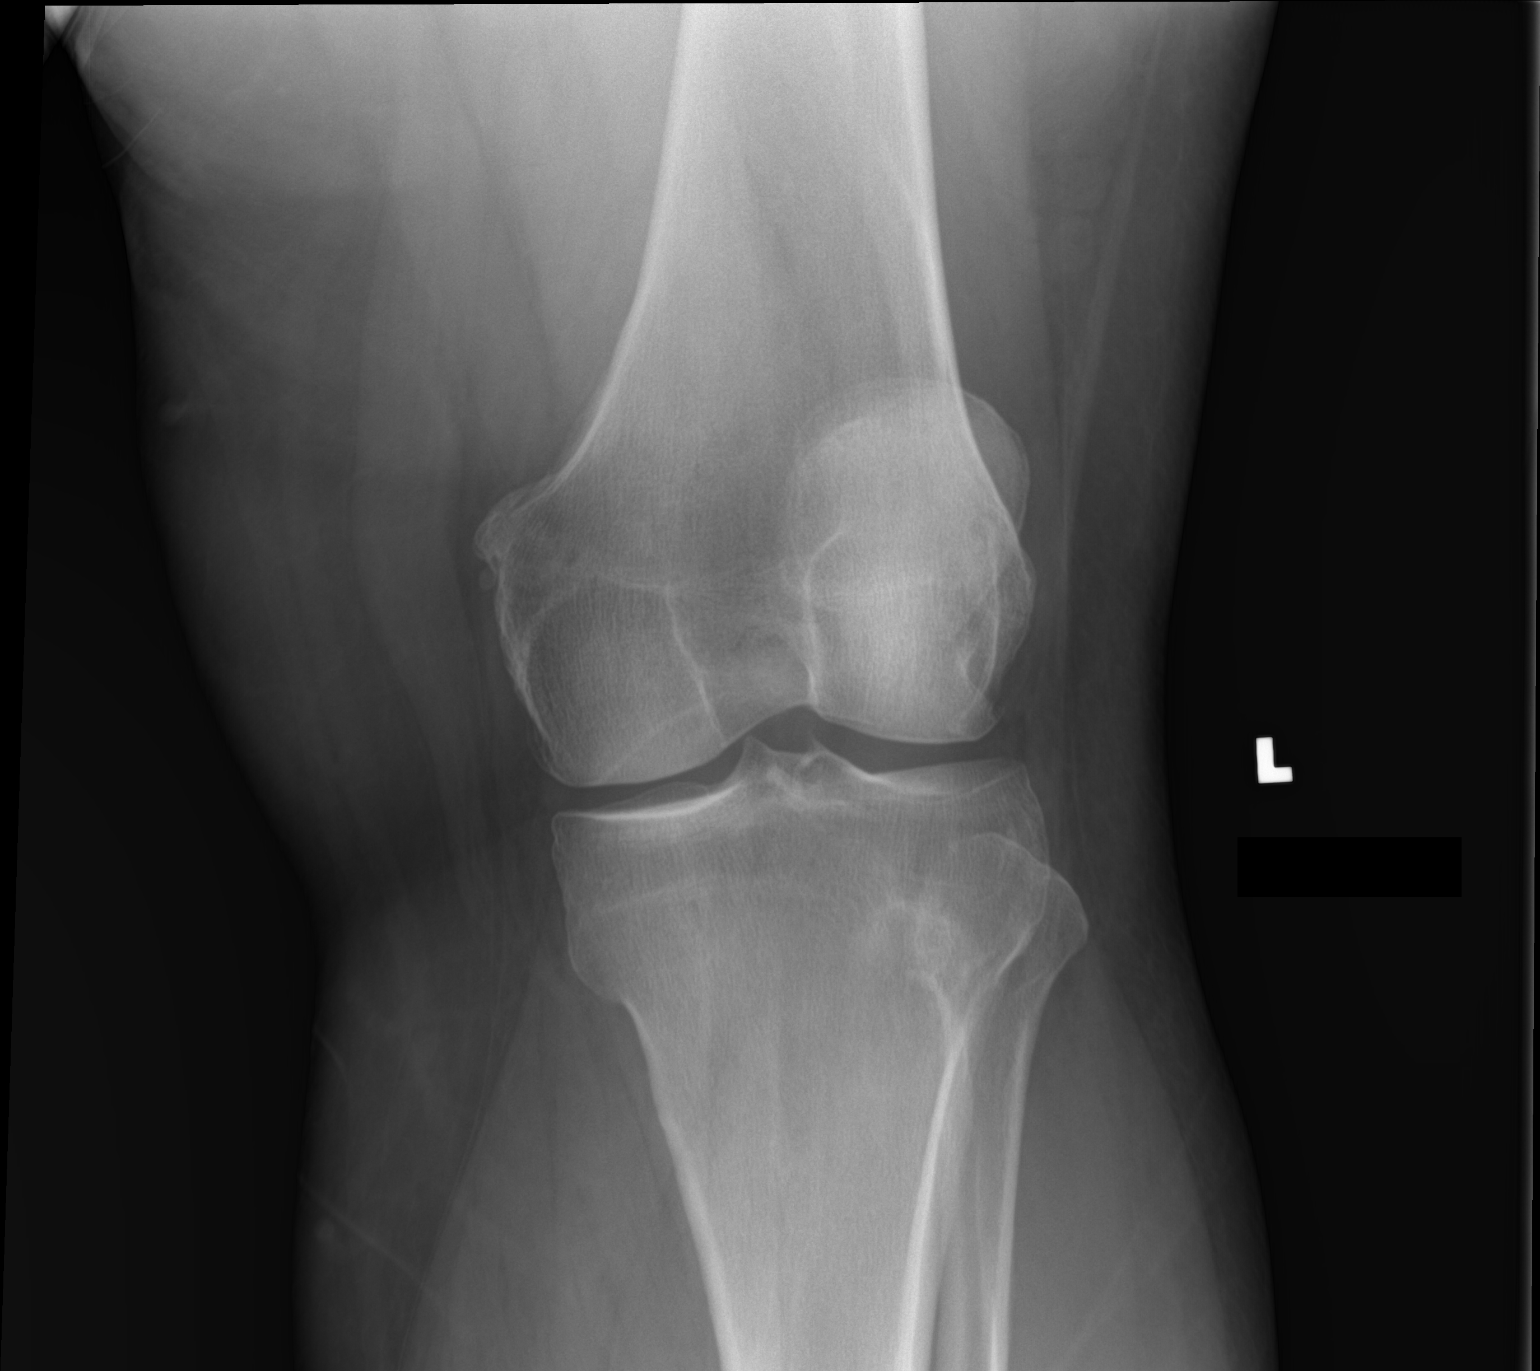

[knee lat]
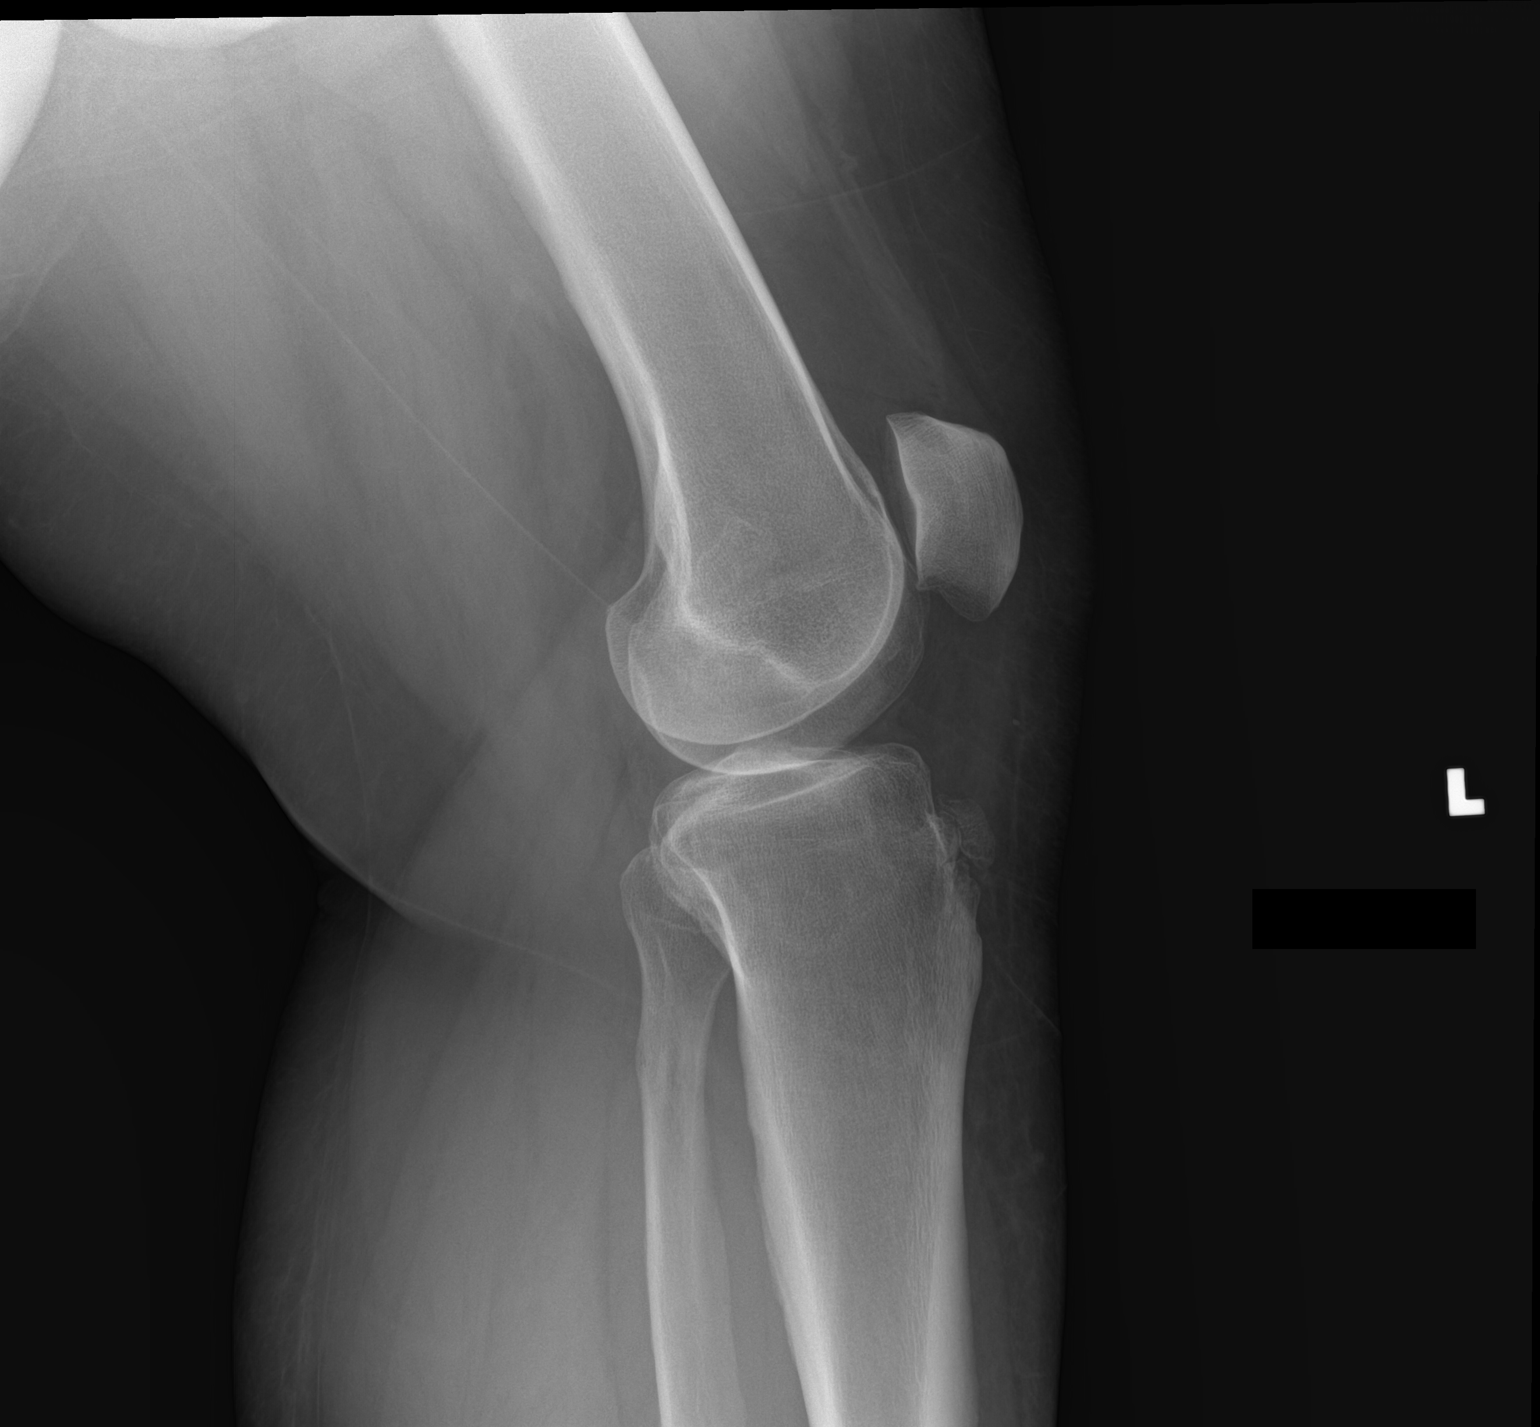

[2 of 2 positions shown; findings below may reference images not displayed]

FINDINGS: Mild tricompartment degenerative change. No acute bony or joint
abnormality identified. No evidence of effusion.
IMPRESSION: Mild tricompartment degenerative change. No acute bony or joint
abnormality identified.

## 2021-01-03 ENCOUNTER — Other Ambulatory Visit: Payer: Self-pay | Admitting: Orthopaedic Surgery

## 2021-01-03 DIAGNOSIS — M160 Bilateral primary osteoarthritis of hip: Secondary | ICD-10-CM

## 2022-10-18 ENCOUNTER — Telehealth: Payer: Self-pay | Admitting: *Deleted

## 2022-10-18 ENCOUNTER — Encounter: Payer: Self-pay | Admitting: Internal Medicine

## 2022-10-18 ENCOUNTER — Ambulatory Visit (INDEPENDENT_AMBULATORY_CARE_PROVIDER_SITE_OTHER): Payer: Self-pay | Admitting: Internal Medicine

## 2022-10-18 DIAGNOSIS — Z1211 Encounter for screening for malignant neoplasm of colon: Secondary | ICD-10-CM

## 2022-10-18 DIAGNOSIS — K7030 Alcoholic cirrhosis of liver without ascites: Secondary | ICD-10-CM

## 2022-10-18 DIAGNOSIS — K219 Gastro-esophageal reflux disease without esophagitis: Secondary | ICD-10-CM | POA: Diagnosis not present

## 2022-10-18 NOTE — Progress Notes (Signed)
Primary Care Physician:  Donetta Potts, MD Primary Gastroenterologist:  Dr. Marletta Lor  Chief Complaint  Patient presents with   New Patient (Initial Visit)    Pt referred for     HPI:   Douglas Branch is a 47 y.o. male who presents to clinic today by referral from his PCP Dr. Mayford Knife.  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 Used to weigh 480 lbs now weighs 323 lbs.   Previous serological workup was negative for viral hepatitis, A1AT deficiency, Wilson disease, Hereditary hemochromatosis and autoimmune hepatitis.   Ultrasound 09/01/2019 with slightly nodular liver suggesting possible cirrhosis.  Slightly enlarged spleen.  Recent blood work showed normal LFTs, normal albumin, platelets 123.  No INR to calculate MELD score.  Denies any history of hepatic encephalopathy.  Denies any abdominal swelling or lower extremity edema.  No previous upper endoscopy.  No previous colonoscopy, no family history of colorectal malignancy.  No melena hematochezia.  No unintentional weight loss.   Overall doing well.  Appears very well compensated.  Past Medical History:  Diagnosis Date   Allergy    seasonal    Arthritis    hips, knee   DDD (degenerative disc disease), lumbar    GERD (gastroesophageal reflux disease)    Sleep apnea     Past Surgical History:  Procedure Laterality Date   ROTATOR CUFF REPAIR Left    TONSILLECTOMY     WISDOM TOOTH EXTRACTION      Current Outpatient Medications  Medication Sig Dispense Refill   glucosamine-chondroitin 500-400 MG tablet Take 2 tablets by mouth daily.     Multiple Vitamin (MULTIVITAMIN WITH MINERALS) TABS tablet Take 1 tablet by mouth daily.     No current facility-administered medications for this visit.    Allergies as of 10/18/2022 - Review Complete 03/30/2020  Allergen Reaction Noted   Pollen extract Itching 11/20/2017    Family History  Problem  Relation Age of Onset   Hyperlipidemia Mother    Hypertension Mother    Diabetes Mother    Heart attack Mother 19       CABG   Hypertension Father    Hyperlipidemia Father    Diabetes Father    Gout Paternal Uncle    Diabetes Maternal Grandmother    Hyperlipidemia Maternal Grandmother    Hypertension Maternal Grandmother    Diabetes Maternal Grandfather    Hyperlipidemia Maternal Grandfather    Hypertension Maternal Grandfather    Diabetes Paternal Grandmother    Hyperlipidemia Paternal Grandmother    Emphysema Paternal Grandmother    Hypertension Paternal Grandmother    Diabetes Paternal Grandfather    Hyperlipidemia Paternal Grandfather    Hypertension Paternal Grandfather     Social History   Socioeconomic History   Marital status: Single    Spouse name: Not on file   Number of children: 3   Years of education: Not on file   Highest education level: Not on file  Occupational History   Not on file  Tobacco Use   Smoking status: Former   Smokeless tobacco: Never  Vaping Use   Vaping status: Never Used  Substance and Sexual Activity   Alcohol use: No   Drug use: No   Sexual activity: Not on file  Other Topics Concern   Not on file  Social History Narrative   Lives with 4 children.    Social Determinants of Health   Financial Resource Strain:  Low Risk  (04/07/2021)   Received from [facility]   Overall Financial Resource Strain (CARDIA)    Difficulty of Paying Living Expenses: Not hard at all  Food Insecurity: No Food Insecurity (04/07/2021)   Received from [facility]   Hunger Vital Sign    Worried About Running Out of Food in the Last Year: Never true    Ran Out of Food in the Last Year: Never true  Transportation Needs: No Transportation Needs (04/07/2021)   Received from [facility] - Transportation    Lack of Transportation (Medical): No    Lack of Transportation (Non-Medical): No  Physical Activity: Inactive (04/07/2021)    Received from [facility]   Exercise Vital Sign    Days of Exercise per Week: 0 days    Minutes of Exercise per Session: 0 min  Stress: No Stress Concern Present (04/07/2021)   Received from [facility]ational [facility] - Occupational Stress Questionnaire    Feeling of Stress : Not at all  Social Connections: Socially Isolated (04/07/2021)   Received from [facility]   Social Connection and Isolation Panel [NHANES]    Frequency of Communication with Friends and Family: More than three times a week    Frequency of Social Gatherings with Friends and Family: Twice a week    Attends Religious Services: Never    Database administrator or Organizations: No    Attends Banker Meetings: Never    Marital Status: Never married  Intimate Partner Violence: Not At Risk (04/07/2021)   Received from [facility]   Humiliation, Afraid, Rape, and Kick questionnaire    Fear of Current or Ex-Partner: No    Emotionally Abused: No    Physically Abused: No    Sexually Abused: No    Subjective: Review of Systems  Constitutional:  Negative for chills and fever.  HENT:  Negative for congestion and hearing loss.   Eyes:  Negative for blurred vision and double vision.  Respiratory:  Negative for cough and shortness of breath.   Cardiovascular:  Negative for chest pain and palpitations.  Gastrointestinal:  Negative for abdominal pain, blood in stool, constipation, diarrhea, heartburn, melena and vomiting.  Genitourinary:  Negative for dysuria and urgency.  Musculoskeletal:  Negative for joint pain and myalgias.  Skin:  Negative for itching and rash.  Neurological:  Negative for dizziness and headaches.  Psychiatric/Behavioral:  Negative for depression. The patient is not nervous/anxious.        Objective: There were no vitals taken for this visit. Physical Exam Constitutional:      Appearance: Normal appearance. He is obese.  HENT:     Head:  Normocephalic and atraumatic.  Eyes:     Extraocular Movements: Extraocular movements intact.     Conjunctiva/sclera: Conjunctivae normal.  Cardiovascular:     Rate and Rhythm: Normal rate and regular rhythm.  Pulmonary:     Effort: Pulmonary effort is normal.     Breath sounds: Normal breath sounds.  Abdominal:     General: Bowel sounds are normal.     Palpations: Abdomen is soft.  Musculoskeletal:        General: Normal range of motion.     Cervical back: Normal range of motion and neck supple.  Skin:    General: Skin is warm.  Neurological:     General: No focal deficit present.     Mental Status: He  is alert and oriented to person, place, and time.  Psychiatric:        Mood and Affect: Mood normal.        Behavior: Behavior normal.      Assessment/Plan:  1.  Cirrhosis-diagnosed after workup for thrombocytopenia in 08/2019. His cirrhosis is thought to be due to alcohol abuse, but likely has significant contribution from [facility] given his morbid obesity and history of DM2   Appears well compensated, No history of hepatic encephalopathy.  No issues with hypervolemia.  Check MELD labs today.  I will schedule for EGD today for variceal screening.  At the same time we will perform colonoscopy for colon cancer screening.  The risks including infection, bleed, or perforation as well as benefits, limitations, alternatives and imponderables have been reviewed with the patient. Potential for esophageal dilation, biopsy, etc. have also been reviewed.  Questions have been answered. All parties agreeable.  Recommend 1-2# weight loss per week until ideal body weight through exercise & diet. Low fat/cholesterol diet.   Avoid sweets, sodas, fruit juices, sweetened beverages like tea, etc. Gradually increase exercise from 15 min daily up to 1 hr per day 5 days/week. Continue sobriety from alcohol  HCC screening: Will check AFP and order ultrasound, may need MRI given his body  habitus.  Vaccinations: -Hepatitis A + B: Needs these vaccinations based on previous serologies -Influenza (yearly): -Pneumococcal: PPSV23 and PCV13. PPSV23 given first if < 9 years of age. PCV13 should be given for age > 70. -Zoster: Shingrix (age > 36). Two doses given 2-6 months apart.    2.  Colon cancer screening-will schedule for colonoscopy.  Thank you Dr. Mayford Knife for the kind referral.  10/18/2022 1:42 PM   Disclaimer: This note was dictated with voice recognition software. Similar sounding words can inadvertently be transcribed and may not be corrected upon review.

## 2022-10-18 NOTE — Telephone Encounter (Signed)
LMOVM to call back to give [place] appt. 8/23, arrival 815am, npo midnight

## 2022-10-18 NOTE — Patient Instructions (Signed)
We will schedule you for upper endoscopy for variceal screening.  At the same time I will perform colonoscopy for colon cancer screening purposes.  I am going to check blood work in regards to your liver today at [facility].  We will call with results.  I am also can order an updated ultrasound of your liver.  We will need to do this every 6 months.  Recommend 1-2# weight loss per week until ideal body weight through exercise & diet. Low fat/cholesterol diet.   Avoid sweets, sodas, fruit juices, sweetened beverages like tea, etc. Gradually increase exercise from 15 min daily up to 1 hr per day 5 days/week. Continue alcohol sobriety.   It was very nice meeting both you today.  Dr. Marletta Lor

## 2022-10-19 ENCOUNTER — Encounter: Payer: Self-pay | Admitting: Internal Medicine

## 2022-10-24 ENCOUNTER — Other Ambulatory Visit: Payer: Self-pay | Admitting: *Deleted

## 2022-10-24 ENCOUNTER — Encounter: Payer: Self-pay | Admitting: *Deleted

## 2022-10-24 MED ORDER — PEG 3350-KCL-NA BICARB-NACL 420 G PO SOLR: 4000.0000 mL | Freq: Once | ORAL | 0 refills | Status: AC

## 2022-10-24 NOTE — Telephone Encounter (Signed)
 Pt informed of [place] appt date, time and instructions.

## 2022-10-25 ENCOUNTER — Telehealth: Payer: Self-pay | Admitting: *Deleted

## 2022-10-25 ENCOUNTER — Encounter: Payer: Self-pay | Admitting: *Deleted

## 2022-10-25 NOTE — Telephone Encounter (Signed)
Pt informed of pre-op appt date and time on Thursday 11/22/22 at 9:00 am. Pt asked that appointment be emailed to him.

## 2022-11-02 ENCOUNTER — Ambulatory Visit (HOSPITAL_COMMUNITY)
Admission: RE | Admit: 2022-11-02 | Discharge: 2022-11-02 | Disposition: A | Discharge status: Home / Self Care | Payer: Managed Care, Other (non HMO) | Source: Ambulatory Visit | Attending: Internal Medicine

## 2022-11-02 DIAGNOSIS — K7030 Alcoholic cirrhosis of liver without ascites: Secondary | ICD-10-CM | POA: Insufficient documentation

## 2022-11-21 NOTE — Patient Instructions (Signed)
Your procedure is scheduled on: 11/26/2022  Report to [facility] Main Entrance at  7:45   AM.  Call this number if you have problems the morning of surgery: [phone]   Remember:              Follow Directions on the letter you received from Your Physician's office regarding the Bowel Prep              No Smoking the day of Procedure :   Take these medicines the morning of surgery with A SIP OF WATER: Oxycodone and Flomax   Do not wear jewelry, make-up or nail polish.    Do not bring valuables to the hospital.  Contacts, dentures or bridgework may not be worn into surgery.  .   Patients discharged the day of surgery will not be allowed to drive home.     Colonoscopy, Adult, Care After This sheet gives you information about how to care for yourself after your procedure. Your health care provider may also give you more specific instructions. If you have problems or questions, contact your health care provider. What can I expect after the procedure? After the procedure, it is common to have: A small amount of blood in your stool for 24 hours after the procedure. Some gas. Mild abdominal cramping or bloating.  Follow these instructions at home: General instructions  For the first 24 hours after the procedure: Do not drive or use machinery. Do not sign important documents. Do not drink alcohol. Do your regular daily activities at a slower pace than normal. Eat soft, easy-to-digest foods. Rest often. Take over-the-counter or prescription medicines only as told by your health care provider. It is up to you to get the results of your procedure. Ask your health care provider, or the department performing the procedure, when your results will be ready. Relieving cramping and bloating Try walking around when you have cramps or feel bloated. Apply heat to your abdomen as told by your health care provider. Use a heat source that your health care provider recommends, such as a moist  heat pack or a heating pad. Place a towel between your skin and the heat source. Leave the heat on for 20-30 minutes. Remove the heat if your skin turns bright red. This is especially important if you are unable to feel pain, heat, or cold. You may have a greater risk of getting burned. Eating and drinking Drink enough fluid to keep your urine clear or pale yellow. Resume your normal diet as instructed by your health care provider. Avoid heavy or fried foods that are hard to digest. Avoid drinking alcohol for as long as instructed by your health care provider. Contact a health care provider if: You have blood in your stool 2-3 days after the procedure. Get help right away if: You have more than a small spotting of blood in your stool. You pass large blood clots in your stool. Your abdomen is swollen. You have nausea or vomiting. You have a fever. You have increasing abdominal pain that is not relieved with medicine. This information is not intended to replace advice given to you by your health care provider. Make sure you discuss any questions you have with your health care provider. Document Released: 10/11/2003 Document Revised: 11/21/2015 Document Reviewed: 05/10/2015 Elsevier Interactive Patient Education  2018 Elsevier Inc. Upper Endoscopy, Adult, Care After After the procedure, it is common to have a sore throat. It is also common to have: Mild stomach  pain or discomfort. Bloating. Nausea. Follow these instructions at home: The instructions below may help you care for yourself at home. Your health care provider may give you more instructions. If you have questions, ask your health care provider. If you were given a sedative during the procedure, it can affect you for several hours. Do not drive or operate machinery until your health care provider says that it is safe. If you will be going home right after the procedure, plan to have a responsible adult: Take you home from the  hospital or clinic. You will not be allowed to drive. Care for you for the time you are told. Follow instructions from your health care provider about what you may eat and drink. Return to your normal activities as told by your health care provider. Ask your health care provider what activities are safe for you. Take over-the-counter and prescription medicines only as told by your health care provider. Contact a health care provider if you: Have a sore throat that lasts longer than one day. Have trouble swallowing. Have a fever. Get help right away if you: Vomit blood or your vomit looks like coffee grounds. Have bloody, black, or tarry stools. Have a very bad sore throat or you cannot swallow. Have difficulty breathing or very bad pain in your chest or abdomen. These symptoms may be an emergency. Get help right away. Call 911. Do not wait to see if the symptoms will go away. Do not drive yourself to the hospital. Summary After the procedure, it is common to have a sore throat, mild stomach discomfort, bloating, and nausea. If you were given a sedative during the procedure, it can affect you for several hours. Do not drive until your health care provider says that it is safe. Follow instructions from your health care provider about what you may eat and drink. Return to your normal activities as told by your health care provider. This information is not intended to replace advice given to you by your health care provider. Make sure you discuss any questions you have with your health care provider. Document Revised: 06/07/2021 Document Reviewed: 06/07/2021 Elsevier Patient Education  2024 Elsevier Inc. Esophageal Dilatation Esophageal dilatation, also called esophageal dilation, is a procedure to widen or open a blocked or narrowed part of the esophagus. The esophagus is the part of the body that moves food and liquid from the mouth to the stomach. You may need this procedure if: You have a  buildup of scar tissue in your esophagus that makes it difficult, painful, or impossible to swallow. This can be caused by gastroesophageal reflux disease (GERD). You have cancer of the esophagus. There is a problem with how food moves through your esophagus. In some cases, you may need this procedure repeated at a later time to dilate the esophagus gradually. Tell a health care provider about: Any allergies you have. All medicines you are taking, including vitamins, herbs, eye drops, creams, and over-the-counter medicines. Any problems you or family members have had with anesthetic medicines. Any blood disorders you have. Any surgeries you have had. Any medical conditions you have. Any antibiotic medicines you are required to take before dental procedures. Whether you are pregnant or may be pregnant. What are the risks? Generally, this is a safe procedure. However, problems may occur, including: Bleeding due to a tear in the lining of the esophagus. A hole, or perforation, in the esophagus. What happens before the procedure? Ask your health care provider about: Changing or  stopping your regular medicines. This is especially important if you are taking diabetes medicines or blood thinners. Taking medicines such as aspirin and ibuprofen. These medicines can thin your blood. Do not take these medicines unless your health care provider tells you to take them. Taking over-the-counter medicines, vitamins, herbs, and supplements. Follow instructions from your health care provider about eating or drinking restrictions. Plan to have a responsible adult take you home from the hospital or clinic. Plan to have a responsible adult care for you for the time you are told after you leave the hospital or clinic. This is important. What happens during the procedure? You may be given a medicine to help you relax (sedative). A numbing medicine may be sprayed into the back of your throat, or you may gargle  the medicine. Your health care provider may perform the dilatation using various surgical instruments, such as: Simple dilators. This instrument is carefully placed in the esophagus to stretch it. Guided wire bougies. This involves using an endoscope to insert a wire into the esophagus. A dilator is passed over this wire to enlarge the esophagus. Then the wire is removed. Balloon dilators. An endoscope with a small balloon is inserted into the esophagus. The balloon is inflated to stretch the esophagus and open it up. The procedure may vary among health care providers and hospitals. What can I expect after the procedure? Your blood pressure, heart rate, breathing rate, and blood oxygen level will be monitored until you leave the hospital or clinic. Your throat may feel slightly sore and numb. This will get better over time. You will not be allowed to eat or drink until your throat is no longer numb. When you are able to drink, urinate, and sit on the edge of the bed without nausea or dizziness, you may be able to return home. Follow these instructions at home: Take over-the-counter and prescription medicines only as told by your health care provider. If you were given a sedative during the procedure, it can affect you for several hours. Do not drive or operate machinery until your health care provider says that it is safe. Plan to have a responsible adult care for you for the time you are told. This is important. Follow instructions from your health care provider about any eating or drinking restrictions. Do not use any products that contain nicotine or tobacco, such as cigarettes, e-cigarettes, and chewing tobacco. If you need help quitting, ask your health care provider. Keep all follow-up visits. This is important. Contact a health care provider if: You have a fever. You have pain that is not relieved by medicine. Get help right away if: You have chest pain. You have trouble breathing. You  have trouble swallowing. You vomit blood. You have black, tarry, or bloody stools. These symptoms may represent a serious problem that is an emergency. Do not wait to see if the symptoms will go away. Get medical help right away. Call your local emergency services (911 in the [place] Do not drive yourself to the hospital. Summary Esophageal dilatation, also called esophageal dilation, is a procedure to widen or open a blocked or narrowed part of the esophagus. Plan to have a responsible adult take you home from the hospital or clinic. For this procedure, a numbing medicine may be sprayed into the back of your throat, or you may gargle the medicine. Do not drive or operate machinery until your health care provider says that it is safe. This information is not intended to replace  advice given to you by your health care provider. Make sure you discuss any questions you have with your health care provider. Document Revised: 07/15/2019 Document Reviewed: 07/15/2019 Elsevier Patient Education  2024 [employer].

## 2022-11-22 ENCOUNTER — Encounter (HOSPITAL_COMMUNITY)
Admission: RE | Admit: 2022-11-22 | Discharge: 2022-11-22 | Disposition: A | Discharge status: Home / Self Care | Payer: Self-pay | Source: Ambulatory Visit | Attending: Internal Medicine | Admitting: Internal Medicine

## 2022-11-22 ENCOUNTER — Other Ambulatory Visit (HOSPITAL_COMMUNITY)
Admission: RE | Admit: 2022-11-22 | Discharge: 2022-11-22 | Disposition: A | Discharge status: Home / Self Care | Payer: Self-pay | Source: Ambulatory Visit | Attending: Internal Medicine | Admitting: Internal Medicine

## 2022-11-22 ENCOUNTER — Encounter (HOSPITAL_COMMUNITY): Payer: Self-pay

## 2022-11-22 ENCOUNTER — Other Ambulatory Visit: Payer: Self-pay

## 2022-11-22 DIAGNOSIS — Z1211 Encounter for screening for malignant neoplasm of colon: Secondary | ICD-10-CM | POA: Insufficient documentation

## 2022-11-22 DIAGNOSIS — Z01812 Encounter for preprocedural laboratory examination: Secondary | ICD-10-CM | POA: Insufficient documentation

## 2022-11-22 DIAGNOSIS — K7030 Alcoholic cirrhosis of liver without ascites: Secondary | ICD-10-CM | POA: Insufficient documentation

## 2022-11-22 HISTORY — DX: Other specified postprocedural states: Z98.890

## 2022-11-22 HISTORY — DX: Nausea with vomiting, unspecified: R11.2

## 2022-11-22 LAB — CBC
HCT: 42 % (ref 39.0–52.0)
Hemoglobin: 14.1 g/dL (ref 13.0–17.0)
MCH: 30.8 pg (ref 26.0–34.0)
MCHC: 33.6 g/dL (ref 30.0–36.0)
MCV: 91.7 fL (ref 80.0–100.0)
Platelets: 100 10*3/uL — ABNORMAL LOW (ref 150–400)
RBC: 4.58 MIL/uL (ref 4.22–5.81)
RDW: 13.1 % (ref 11.5–15.5)
WBC: 5 10*3/uL (ref 4.0–10.5)
nRBC: 0 % (ref 0.0–0.2)

## 2022-11-22 LAB — COMPREHENSIVE METABOLIC PANEL
ALT: 24 U/L (ref 0–44)
AST: 29 U/L (ref 15–41)
Albumin: 4.1 g/dL (ref 3.5–5.0)
Alkaline Phosphatase: 68 U/L (ref 38–126)
Anion gap: 10 (ref 5–15)
BUN: 18 mg/dL (ref 6–20)
CO2: 23 mmol/L (ref 22–32)
Calcium: 9.1 mg/dL (ref 8.9–10.3)
Chloride: 101 mmol/L (ref 98–111)
Creatinine, Ser: 0.59 mg/dL — ABNORMAL LOW (ref 0.61–1.24)
GFR, Estimated: >60 mL/min (ref >60)
Glucose, Bld: 101 mg/dL — ABNORMAL HIGH (ref 70–99)
Potassium: 4.1 mmol/L (ref 3.5–5.1)
Sodium: 134 mmol/L — ABNORMAL LOW (ref 135–145)
Total Bilirubin: 0.7 mg/dL (ref 0.3–1.2)
Total Protein: 7.4 g/dL (ref 6.5–8.1)

## 2022-11-22 LAB — PROTIME-INR
INR: 1 (ref 0.8–1.2)
Prothrombin Time: 13.7 s (ref 11.4–15.2)

## 2022-11-22 LAB — VITAMIN D 25 HYDROXY (VIT D DEFICIENCY, FRACTURES): Vit D, 25-Hydroxy: 31.62 ng/mL (ref 30–100)

## 2022-11-23 LAB — AFP TUMOR MARKER: AFP, Serum, Tumor Marker: 1.9 ng/mL (ref 0.0–6.9)

## 2022-11-26 ENCOUNTER — Ambulatory Visit (HOSPITAL_COMMUNITY)
Admission: RE | Admit: 2022-11-26 | Discharge: 2022-11-26 | Disposition: A | Discharge status: Home / Self Care | Payer: Self-pay | Source: Ambulatory Visit | Attending: Internal Medicine | Admitting: Internal Medicine

## 2022-11-26 ENCOUNTER — Ambulatory Visit (HOSPITAL_COMMUNITY): Payer: Self-pay | Admitting: Certified Registered"

## 2022-11-26 ENCOUNTER — Encounter (HOSPITAL_COMMUNITY)
Admission: RE | Disposition: A | Discharge status: Home / Self Care | Payer: Self-pay | Source: Ambulatory Visit | Attending: Internal Medicine

## 2022-11-26 ENCOUNTER — Ambulatory Visit (HOSPITAL_BASED_OUTPATIENT_CLINIC_OR_DEPARTMENT_OTHER): Payer: Self-pay | Admitting: Certified Registered"

## 2022-11-26 ENCOUNTER — Encounter (HOSPITAL_COMMUNITY): Payer: Self-pay

## 2022-11-26 DIAGNOSIS — G4730 Sleep apnea, unspecified: Secondary | ICD-10-CM | POA: Insufficient documentation

## 2022-11-26 DIAGNOSIS — K2970 Gastritis, unspecified, without bleeding: Secondary | ICD-10-CM

## 2022-11-26 DIAGNOSIS — D125 Benign neoplasm of sigmoid colon: Secondary | ICD-10-CM | POA: Insufficient documentation

## 2022-11-26 DIAGNOSIS — D126 Benign neoplasm of colon, unspecified: Secondary | ICD-10-CM

## 2022-11-26 DIAGNOSIS — D123 Benign neoplasm of transverse colon: Secondary | ICD-10-CM | POA: Insufficient documentation

## 2022-11-26 DIAGNOSIS — Z1211 Encounter for screening for malignant neoplasm of colon: Secondary | ICD-10-CM | POA: Insufficient documentation

## 2022-11-26 DIAGNOSIS — K648 Other hemorrhoids: Secondary | ICD-10-CM | POA: Insufficient documentation

## 2022-11-26 DIAGNOSIS — Z6841 Body Mass Index (BMI) 40.0 and over, adult: Secondary | ICD-10-CM

## 2022-11-26 DIAGNOSIS — K219 Gastro-esophageal reflux disease without esophagitis: Secondary | ICD-10-CM | POA: Insufficient documentation

## 2022-11-26 DIAGNOSIS — Z87891 Personal history of nicotine dependence: Secondary | ICD-10-CM | POA: Insufficient documentation

## 2022-11-26 DIAGNOSIS — K7460 Unspecified cirrhosis of liver: Secondary | ICD-10-CM | POA: Insufficient documentation

## 2022-11-26 DIAGNOSIS — Z13810 Encounter for screening for upper gastrointestinal disorder: Secondary | ICD-10-CM | POA: Insufficient documentation

## 2022-11-26 HISTORY — PX: ESOPHAGOGASTRODUODENOSCOPY (EGD) WITH PROPOFOL: SHX5813

## 2022-11-26 HISTORY — PX: COLONOSCOPY WITH PROPOFOL: SHX5780

## 2022-11-26 HISTORY — PX: HEMOSTASIS CLIP PLACEMENT: SHX6857

## 2022-11-26 HISTORY — PX: POLYPECTOMY: SHX5525

## 2022-11-26 SURGERY — COLONOSCOPY WITH PROPOFOL
Anesthesia: General

## 2022-11-26 MED ORDER — LIDOCAINE HCL (CARDIAC) PF 100 MG/5ML IV SOSY
PREFILLED_SYRINGE | INTRAVENOUS | Status: DC | PRN
  Administered 2022-11-26: 50 mg via INTRAVENOUS

## 2022-11-26 MED ORDER — PROPOFOL 500 MG/50ML IV EMUL
INTRAVENOUS | Status: DC | PRN
  Administered 2022-11-26: 150 ug/kg/min via INTRAVENOUS

## 2022-11-26 MED ORDER — PROPOFOL 10 MG/ML IV BOLUS
INTRAVENOUS | Status: DC | PRN
  Administered 2022-11-26 (×3): 50 mg via INTRAVENOUS
  Administered 2022-11-26: 100 mg via INTRAVENOUS
  Administered 2022-11-26: 50 mg via INTRAVENOUS

## 2022-11-26 MED ORDER — LACTATED RINGERS IV SOLN: INTRAVENOUS | Status: DC | PRN

## 2022-11-26 MED ORDER — LACTATED RINGERS IV SOLN: INTRAVENOUS | Status: DC

## 2022-11-26 MED ORDER — KETAMINE HCL 10 MG/ML IJ SOLN
INTRAMUSCULAR | Status: DC | PRN
Start: 2022-11-26 — End: 2022-11-26
  Administered 2022-11-26: 20 mg via INTRAVENOUS

## 2022-11-26 MED ORDER — STERILE WATER FOR IRRIGATION IR SOLN
Status: DC | PRN
  Administered 2022-11-26: 120 mL

## 2022-11-26 MED ORDER — DEXMEDETOMIDINE HCL IN NACL 80 MCG/20ML IV SOLN
INTRAVENOUS | Status: AC
  Filled 2022-11-26: qty 20

## 2022-11-26 MED ORDER — ONDANSETRON HCL 4 MG/2ML IJ SOLN
INTRAMUSCULAR | Status: AC
  Filled 2022-11-26: qty 2

## 2022-11-26 MED ORDER — DEXMEDETOMIDINE HCL IN NACL 80 MCG/20ML IV SOLN
INTRAVENOUS | Status: DC | PRN
Start: 2022-11-26 — End: 2022-11-26
  Administered 2022-11-26: 12 ug via INTRAVENOUS
  Administered 2022-11-26: 8 ug via INTRAVENOUS

## 2022-11-26 MED ORDER — KETAMINE HCL 50 MG/5ML IJ SOSY
PREFILLED_SYRINGE | INTRAMUSCULAR | Status: AC
  Filled 2022-11-26: qty 5

## 2022-11-26 MED ORDER — ONDANSETRON HCL 4 MG/2ML IJ SOLN
4.0000 mg | Freq: Once | INTRAMUSCULAR | Status: AC
  Administered 2022-11-26: 4 mg via INTRAVENOUS

## 2022-11-26 NOTE — Addendum Note (Signed)
Addendum  created 11/26/22 1031 by Julian Reil, CRNA   Intraprocedure Event edited

## 2022-11-26 NOTE — Discharge Instructions (Addendum)
EGD Discharge instructions Please read the instructions outlined below and refer to this sheet in the next few weeks. These discharge instructions provide you with general information on caring for yourself after you leave the hospital. Your doctor may also give you specific instructions. While your treatment has been planned according to the most current medical practices available, unavoidable complications occasionally occur. If you have any problems or questions after discharge, please call your doctor. ACTIVITY You may resume your regular activity but move at a slower pace for the next 24 hours.  Take frequent rest periods for the next 24 hours.  Walking will help expel (get rid of) the air and reduce the bloated feeling in your abdomen.  No driving for 24 hours (because of the anesthesia (medicine) used during the test).  You may shower.  Do not sign any important legal documents or operate any machinery for 24 hours (because of the anesthesia used during the test).  NUTRITION Drink plenty of fluids.  You may resume your normal diet.  Begin with a light meal and progress to your normal diet.  Avoid alcoholic beverages for 24 hours or as instructed by your caregiver.  MEDICATIONS You may resume your normal medications unless your caregiver tells you otherwise.  WHAT YOU CAN EXPECT TODAY You may experience abdominal discomfort such as a feeling of fullness or "gas" pains.  FOLLOW-UP Your doctor will discuss the results of your test with you.  SEEK IMMEDIATE MEDICAL ATTENTION IF ANY OF THE FOLLOWING OCCUR: Excessive nausea (feeling sick to your stomach) and/or vomiting.  Severe abdominal pain and distention (swelling).  Trouble swallowing.  Temperature over 101 F (37.8 C).  Rectal bleeding or vomiting of blood.     Colonoscopy Discharge Instructions  Read the instructions outlined below and refer to this sheet in the next few weeks. These discharge instructions provide you with  general information on caring for yourself after you leave the hospital. Your doctor may also give you specific instructions. While your treatment has been planned according to the most current medical practices available, unavoidable complications occasionally occur.   ACTIVITY You may resume your regular activity, but move at a slower pace for the next 24 hours.  Take frequent rest periods for the next 24 hours.  Walking will help get rid of the air and reduce the bloated feeling in your belly (abdomen).  No driving for 24 hours (because of the medicine (anesthesia) used during the test).   Do not sign any important legal documents or operate any machinery for 24 hours (because of the anesthesia used during the test).  NUTRITION Drink plenty of fluids.  You may resume your normal diet as instructed by your doctor.  Begin with a light meal and progress to your normal diet. Heavy or fried foods are harder to digest and may make you feel sick to your stomach (nauseated).  Avoid alcoholic beverages for 24 hours or as instructed.  MEDICATIONS You may resume your normal medications unless your doctor tells you otherwise.  WHAT YOU CAN EXPECT TODAY Some feelings of bloating in the abdomen.  Passage of more gas than usual.  Spotting of blood in your stool or on the toilet paper.  IF YOU HAD POLYPS REMOVED DURING THE COLONOSCOPY: No aspirin products for 7 days or as instructed.  No alcohol for 7 days or as instructed.  Eat a soft diet for the next 24 hours.  FINDING OUT THE RESULTS OF YOUR TEST Not all test results are  available during your visit. If your test results are not back during the visit, make an appointment with your caregiver to find out the results. Do not assume everything is normal if you have not heard from your caregiver or the medical facility. It is important for you to follow up on all of your test results.  SEEK IMMEDIATE MEDICAL ATTENTION IF: You have more than a spotting of  blood in your stool.  Your belly is swollen (abdominal distention).  You are nauseated or vomiting.  You have a temperature over 101.  You have abdominal pain or discomfort that is severe or gets worse throughout the day.   Your upper endoscopy was relatively unremarkable.  I did not find any evidence of esophageal varices.  Mild inflammation your stomach.  Small bowel appeared normal.  Repeat upper endoscopy in 3 years.  Your colonoscopy revealed 3 polyp(s) which I removed successfully. Await pathology results, my office will contact you. I recommend repeating colonoscopy in  years for surveillance purposes.    One of the polyps was medium size, after successful removal I did place a metallic clip to close the defect.  This will naturally fall off over the next few months.  If you need an MRI for any reason in the near future, you will need x-ray prior to ensure that the clip is no longer attached to your colon.  Follow up in Gi office in 6 months  I hope you have a great rest of your week!  Douglas Branch. Marletta Lor, D.O. Gastroenterology and Hepatology [facility]

## 2022-11-26 NOTE — Op Note (Signed)
[facility] Patient Name: Douglas Branch Procedure Date: 11/26/2022 8:06 AM MRN: [id number] Date of Birth: 1975/04/07 Attending MD: Hennie Duos. Marletta Lor , [place], [id number] CSN: [id number] Age: 47 Admit Type: Outpatient Procedure:                Colonoscopy Indications:              Screening for colorectal malignant neoplasm Providers:                Hennie Duos. Marletta Lor, DO, Sheran Fava, Pandora Leiter, Technician Referring MD:              Medicines:                See the Anesthesia note for documentation of the                            administered medications Complications:            No immediate complications. Estimated Blood Loss:     Estimated blood loss was minimal. Procedure:                Pre-Anesthesia Assessment:                           - The anesthesia plan was to use monitored                            anesthesia care (MAC).                           After obtaining informed consent, the colonoscope                            was passed under direct vision. Throughout the                            procedure, the patient's blood pressure, pulse, and                            oxygen saturations were monitored continuously. The                            [id number]-HQ190L ([id number]) scope was introduced through                            the anus and advanced to the the cecum, identified                            by appendiceal orifice and ileocecal valve. The                            colonoscopy was performed without difficulty. The                            patient tolerated the procedure well. The  quality                            of the bowel preparation was evaluated using the                            BBPS [facility] Bowel Preparation Scale) with scores                            of: Right Colon = 2 (minor amount of residual                            staining, small fragments of stool and/or opaque                             liquid, but mucosa seen well), Transverse Colon = 2                            (minor amount of residual staining, small fragments                            of stool and/or opaque liquid, but mucosa seen                            well) and Left Colon = 2 (minor amount of residual                            staining, small fragments of stool and/or opaque                            liquid, but mucosa seen well). The total BBPS score                            equals 6. The quality of the bowel preparation was                            good. Scope In: 8:26:53 AM Scope Out: 8:51:09 AM Scope Withdrawal Time: 0 hours 22 minutes 4 seconds  Total Procedure Duration: 0 hours 24 minutes 16 seconds  Findings:      Hemorrhoids were found on perianal exam.      Non-bleeding internal hemorrhoids were found during endoscopy.      Two sessile polyps were found in the transverse colon. The polyps were 4       to 6 mm in size. These polyps were removed with a cold snare. Resection       and retrieval were complete.      A 12 mm polyp was found in the sigmoid colon. The polyp was       pedunculated. The polyp was removed with a cold snare. Resection and       retrieval were complete. For hemostasis, one hemostatic clip was       successfully placed (MR conditional). Clip manufacturer: [employer]. There was no bleeding at the end of the procedure. Impression:               -  Hemorrhoids found on perianal exam.                           - Non-bleeding internal hemorrhoids.                           - Two 4 to 6 mm polyps in the transverse colon,                            removed with a cold snare. Resected and retrieved.                           - One 12 mm polyp in the sigmoid colon, removed                            with a cold snare. Resected and retrieved. Clip (MR                            conditional) was placed. Clip manufacturer: [employer]. Moderate Sedation:      Per Anesthesia Care Recommendation:           - Patient has a contact number available for                            emergencies. The signs and symptoms of potential                            delayed complications were discussed with the                            patient. Return to normal activities tomorrow.                            Written discharge instructions were provided to the                            patient.                           - Resume previous diet.                           - Continue present medications.                           - Await pathology results.                           - Repeat colonoscopy in 3 years for surveillance.                           - Return to GI clinic in 6 months.                           -  PATIENT WILL NEED KUB PRIOR TO ANY MRI DONE IN                            THE NEAR FUTURE DUE TO CLIP PLACEMENT TODAY Procedure Code(s):        --- Professional ---                           [phone], Colonoscopy, flexible; with removal of                            tumor(s), polyp(s), or other lesion(s) by snare                            technique Diagnosis Code(s):        --- Professional ---                           Z12.11, Encounter for screening for malignant                            neoplasm of colon                           D12.3, Benign neoplasm of transverse colon (hepatic                            flexure or splenic flexure)                           D12.5, Benign neoplasm of sigmoid colon                           K64.8, Other hemorrhoids CPT copyright 2022 American Medical Association. All rights reserved. The codes documented in this report are preliminary and upon coder review may  be revised to meet current compliance requirements. Hennie Duos. Marletta Lor, DO Hennie Duos. Marletta Lor, DO 11/26/2022 8:55:19 AM This report has been signed electronically. Number of Addenda: 0

## 2022-11-26 NOTE — Anesthesia Postprocedure Evaluation (Signed)
Anesthesia Post Note  Patient: Douglas Branch  Procedure(s) Performed: COLONOSCOPY WITH PROPOFOL ESOPHAGOGASTRODUODENOSCOPY (EGD) WITH PROPOFOL POLYPECTOMY HEMOSTASIS CLIP PLACEMENT  Patient location during evaluation: Phase II Anesthesia Type: General Level of consciousness: awake Pain management: pain level controlled Vital Signs Assessment: post-procedure vital signs reviewed and stable Respiratory status: spontaneous breathing and respiratory function stable Cardiovascular status: blood pressure returned to baseline and stable Postop Assessment: no headache and no apparent nausea or vomiting Anesthetic complications: no Comments: Late entry   No notable events documented.   Last Vitals:  Vitals:   11/26/22 0859 11/26/22 0903  BP:  (!) 142/56  Pulse: 67 (!) 59  Resp: 17 16  Temp: 36.7 C   SpO2: 100% 100%    Last Pain:  Vitals:   11/26/22 0859  TempSrc: Axillary  PainSc:                  Windell [place]

## 2022-11-26 NOTE — Anesthesia Preprocedure Evaluation (Addendum)
Anesthesia Evaluation  Patient identified by MRN, date of birth, ID band Patient awake    Reviewed: Allergy & Precautions, H&P , NPO status , Patient's Chart, lab work & pertinent test results, reviewed documented beta blocker date and time   History of Anesthesia Complications (+) PONV and history of anesthetic complications  Airway Mallampati: II  TM Distance: >3 FB Neck ROM: full    Dental  (+) Dental Advisory Given, Chipped,    Pulmonary sleep apnea , former smoker   Pulmonary exam normal breath sounds clear to auscultation       Cardiovascular Exercise Tolerance: Good negative cardio ROS  Rhythm:regular Rate:Normal     Neuro/Psych negative neurological ROS  negative psych ROS   GI/Hepatic negative GI ROS, Neg liver ROS,GERD  ,,  Endo/Other  negative endocrine ROS    Renal/GU negative Renal ROS  negative genitourinary   Musculoskeletal   Abdominal   Peds  Hematology negative hematology ROS (+)   Anesthesia Other Findings   Reproductive/Obstetrics negative OB ROS                             Anesthesia Physical Anesthesia Plan  ASA: 3  Anesthesia Plan: General   Post-op Pain Management:    Induction:   PONV Risk Score and Plan: Propofol infusion  Airway Management Planned:   Additional Equipment:   Intra-op Plan:   Post-operative Plan:   Informed Consent: I have reviewed the patients History and Physical, chart, labs and discussed the procedure including the risks, benefits and alternatives for the proposed anesthesia with the patient or authorized representative who has indicated his/her understanding and acceptance.     Dental Advisory Given  Plan Discussed with: CRNA  Anesthesia Plan Comments:        Anesthesia Quick Evaluation

## 2022-11-26 NOTE — Transfer of Care (Signed)
Immediate Anesthesia Transfer of Care Note  Patient: Douglas Branch  Procedure(s) Performed: COLONOSCOPY WITH PROPOFOL ESOPHAGOGASTRODUODENOSCOPY (EGD) WITH PROPOFOL POLYPECTOMY HEMOSTASIS CLIP PLACEMENT  Patient Location: Short Stay  Anesthesia Type:General  Level of Consciousness: sedated  Airway & Oxygen Therapy: Patient Spontanous Breathing  Post-op Assessment: Report given to RN and Post -op Vital signs reviewed and stable  Post vital signs: Reviewed and stable  Last Vitals:  Vitals Value Taken Time  BP 163/132 11/26/22 0855  Temp    Pulse 85 11/26/22 0856  Resp 16 11/26/22 0857  SpO2 100 % 11/26/22 0856  Vitals shown include unfiled device data.  Last Pain:  Vitals:   11/26/22 0812  PainSc: 5          Complications: No notable events documented.

## 2022-11-26 NOTE — Op Note (Signed)
[facility] Patient Name: Douglas Branch Procedure Date: 11/26/2022 8:07 AM MRN: [id number] Date of Birth: Aug 20, 1975 Attending MD: Hennie Duos. Marletta Lor , [place], [id number] CSN: [id number] Age: 47 Admit Type: Outpatient Procedure:                Upper GI endoscopy Indications:              Screening procedure, Cirrhosis rule out esophageal                            varices Providers:                Hennie Duos. Marletta Lor, DO, Sheran Fava, Pandora Leiter, Technician Referring MD:              Medicines:                See the Anesthesia note for documentation of the                            administered medications Complications:            No immediate complications. Estimated Blood Loss:     Estimated blood loss: none. Procedure:                Pre-Anesthesia Assessment:                           - The anesthesia plan was to use monitored                            anesthesia care (MAC).                           After obtaining informed consent, the endoscope was                            passed under direct vision. Throughout the                            procedure, the patient's blood pressure, pulse, and                            oxygen saturations were monitored continuously. The                            [id number]-H190 ([id number]) scope was introduced through the                            mouth, and advanced to the second part of duodenum.                            The upper GI endoscopy was accomplished without                            difficulty. The patient tolerated the procedure  well. Scope In: 8:18:43 AM Scope Out: 8:22:05 AM Total Procedure Duration: 0 hours 3 minutes 22 seconds  Findings:      There is no endoscopic evidence of varices in the entire esophagus.      The Z-line was regular and was found 39 cm from the incisors.      Patchy mild inflammation characterized by erythema was found in the        gastric body.      There is no endoscopic evidence of varices in the entire examined       stomach.      The duodenal bulb, first portion of the duodenum and second portion of       the duodenum were normal. Impression:               - Z-line regular, 39 cm from the incisors.                           - Gastritis.                           - Normal duodenal bulb, first portion of the                            duodenum and second portion of the duodenum.                           - No specimens collected. Moderate Sedation:      Per Anesthesia Care Recommendation:           - Patient has a contact number available for                            emergencies. The signs and symptoms of potential                            delayed complications were discussed with the                            patient. Return to normal activities tomorrow.                            Written discharge instructions were provided to the                            patient.                           - Resume previous diet.                           - Continue present medications.                           - Repeat upper endoscopy in 3 years for screening                            purposes.                           -  Return to GI clinic in 6 months. Procedure Code(s):        --- Professional ---                           [phone], Esophagogastroduodenoscopy, flexible,                            transoral; diagnostic, including collection of                            specimen(s) by brushing or washing, when performed                            (separate procedure) Diagnosis Code(s):        --- Professional ---                           K29.70, Gastritis, unspecified, without bleeding                           Z13.810, Encounter for screening for upper                            gastrointestinal disorder                           K74.60, Unspecified cirrhosis of liver CPT copyright 2022 American Medical  Association. All rights reserved. The codes documented in this report are preliminary and upon coder review may  be revised to meet current compliance requirements. Hennie Duos. Marletta Lor, DO Hennie Duos. Marletta Lor, DO 11/26/2022 8:24:16 AM This report has been signed electronically. Number of Addenda: 0

## 2022-11-26 NOTE — Anesthesia Procedure Notes (Signed)
Date/Time: 11/26/2022 8:29 AM  Performed by: Julian Reil, CRNAPre-anesthesia Checklist: Patient identified, Emergency Drugs available, Suction available and Patient being monitored Patient Re-evaluated:Patient Re-evaluated prior to induction Oxygen Delivery Method: Nasal cannula Induction Type: IV induction Placement Confirmation: positive ETCO2 Comments: Optiflow High Flow  O2 used.

## 2022-11-26 NOTE — H&P (Signed)
Primary Care Physician:  Donetta Potts, MD Primary Gastroenterologist:  Dr. Marletta Lor  Pre-Procedure History & Physical: HPI:  Douglas Branch is a 47 y.o. male is here for an upper endoscopy for cirrhosis/variceal screening and a colonoscopy to be performed for colon cancer screening purposes. Past Medical History:  Diagnosis Date   Allergy    seasonal    Arthritis    hips, knee   DDD (degenerative disc disease), lumbar    GERD (gastroesophageal reflux disease)    PONV (postoperative nausea and vomiting)    Sleep apnea     Past Surgical History:  Procedure Laterality Date   ROTATOR CUFF REPAIR Left 2010   TONSILLECTOMY     WISDOM TOOTH EXTRACTION      Prior to Admission medications   Medication Sig Start Date End Date Taking? Authorizing Provider  glucosamine-chondroitin 500-400 MG tablet Take 2 tablets by mouth daily.   Yes [provider]  Multiple Vitamin (MULTIVITAMIN WITH MINERALS) TABS tablet Take 1 tablet by mouth daily.   Yes [provider]  oxyCODONE (OXY IR/ROXICODONE) 5 MG immediate release tablet Take 5 mg by mouth every 4 (four) hours as needed for severe pain.   Yes [provider]  tamsulosin (FLOMAX) 0.4 MG CAPS capsule Take 0.4 mg by mouth.   Yes [provider]    Allergies as of 10/24/2022 - Review Complete 10/18/2022  Allergen Reaction Noted   Pollen extract Itching 11/20/2017    Family History  Problem Relation Age of Onset   Hyperlipidemia Mother    Hypertension Mother    Diabetes Mother    Heart attack Mother 73       CABG   Hypertension Father    Hyperlipidemia Father    Diabetes Father    Gout Paternal Uncle    Diabetes Maternal Grandmother    Hyperlipidemia Maternal Grandmother    Hypertension Maternal Grandmother    Diabetes Maternal Grandfather    Hyperlipidemia Maternal Grandfather    Hypertension Maternal Grandfather    Diabetes Paternal Grandmother    Hyperlipidemia Paternal Grandmother     Emphysema Paternal Grandmother    Hypertension Paternal Grandmother    Diabetes Paternal Grandfather    Hyperlipidemia Paternal Grandfather    Hypertension Paternal Grandfather     Social History   Socioeconomic History   Marital status: Divorced    Spouse name: Not on file   Number of children: 3   Years of education: Not on file   Highest education level: Not on file  Occupational History   Not on file  Tobacco Use   Smoking status: Former   Smokeless tobacco: Never  Vaping Use   Vaping status: Never Used  Substance and Sexual Activity   Alcohol use: No   Drug use: Yes    Types: Marijuana   Sexual activity: Not on file  Other Topics Concern   Not on file  Social History Narrative   Lives with 4 children.    Social Determinants of Health   Financial Resource Strain: Low Risk  (04/07/2021)   Received from [facility]   Overall Financial Resource Strain (CARDIA)    Difficulty of Paying Living Expenses: Not hard at all  Food Insecurity: No Food Insecurity (04/07/2021)   Received from [facility]   Hunger Vital Sign    Worried About Running Out of Food in the Last Year: Never true    Ran Out of Food in the Last Year: Never true  Transportation Needs:  No Transportation Needs (04/07/2021)   Received [facility] - Transportation    Lack of Transportation (Medical): No    Lack of Transportation (Non-Medical): No  Physical Activity: Inactive (04/07/2021)   Received from [facility]   Exercise Vital Sign    Days of Exercise per Week: 0 days    Minutes of Exercise per Session: 0 min  Stress: No Stress Concern Present (04/07/2021)   Received from [facility] - Occupational Stress Questionnaire    Feeling of Stress : Not at all  Social Connections: Socially Isolated (04/07/2021)   Received from [facility]   Social Connection and Isolation Panel [NHANES]    Frequency of Communication with  Friends and Family: More than three times a week    Frequency of Social Gatherings with Friends and Family: Twice a week    Attends Religious Services: Never    Database administrator or Organizations: No    Attends Banker Meetings: Never    Marital Status: Never married  Intimate Partner Violence: Not At Risk (04/07/2021)   Received from [facility]   Humiliation, Afraid, Rape, and Kick questionnaire    Fear of Current or Ex-Partner: No    Emotionally Abused: No    Physically Abused: No    Sexually Abused: No    Review of Systems: See HPI, otherwise negative ROS  Physical Exam: Vital signs in last 24 hours:     General:   Alert,  Well-developed, well-nourished, pleasant and cooperative in NAD Head:  Normocephalic and atraumatic. Eyes:  Sclera clear, no icterus.   Conjunctiva pink. Ears:  Normal auditory acuity. Nose:  No deformity, discharge,  or lesions. Msk:  Symmetrical without gross deformities. Normal posture. Extremities:  Without clubbing or edema. Neurologic:  Alert and  oriented x4;  grossly normal neurologically. Skin:  Intact without significant lesions or rashes. Psych:  Alert and cooperative. Normal mood and affect.  Impression/Plan: Douglas Branch is here for an upper endoscopy for cirrhosis/variceal screening and a colonoscopy to be performed for colon cancer screening purposes.  The risks of the procedure including infection, bleed, or perforation as well as benefits, limitations, alternatives and imponderables have been reviewed with the patient. Questions have been answered. All parties agreeable.

## 2022-11-27 LAB — SURGICAL PATHOLOGY

## 2022-11-29 ENCOUNTER — Ambulatory Visit: Payer: Self-pay | Admitting: *Deleted

## 2022-11-29 NOTE — Telephone Encounter (Signed)
  Chief Complaint:  Daughter, Eunice Blase called in on the community line of the [facility].     Nausea, vomiting, shaking.   Had a colonoscopy on 11/26/2022.   Did not feel well after the procedure.   He had nausea and vomiting after the procedure.    Slept the rest of that day.   Symptoms: Yesterday he was fine but today he is vomiting again and having abd pain and shaking.   He has been constipated this week. Frequency: Since colonoscopy on 9/16. Pertinent Negatives: Patient denies diarrhea  Disposition: [] ED /[] Urgent Care (no appt availability in office) / [] Appointment(In office/virtual)/ []  [place] [facility]/ [] Home Care/ [] Refused Recommended Disposition /[] [place] [facility]/ [x]  Follow-up with PCP Additional Notes: Mila, daughter (on [place]) said he has a PCP so I suggested she give him a call because he could be having complications from the procedure or just a virus but he needs to be evaluated.   She was agreeable to this plan and thanked me for my help.

## 2022-11-29 NOTE — Telephone Encounter (Signed)
Daughter  Reason for Disposition  [1] MILD or MODERATE vomiting AND [2] present > 48 hours (2 days) (Exception: Mild vomiting with associated diarrhea.)    Had colonoscopy on 11/26/2022  Answer Assessment - Initial Assessment Questions 1. VOMITING SEVERITY: "How many times have you vomited in the past 24 hours?"     - MILD:  1 - 2 times/day    - MODERATE: 3 - 5 times/day, decreased oral intake without significant weight loss or symptoms of dehydration    - SEVERE: 6 or more times/day, vomits everything or nearly everything, with significant weight loss, symptoms of dehydration      Daughter [place], on [place] calling in.   He is having vomiting and nausea and shaking.  9/16 he had a colonoscopy.   Yesterday he was fine.   But this morning he is having nausea and vomiting and shaking.    No fever.    Been constipated.   His stomach is hurting and having abd pain.     2. ONSET: "When did the vomiting begin?"      He was feeling bad after the colonoscopy.   He vomited and had nausea after the colonoscopy.  He stayed in bed all day after the procedure.  He felt fine yesterday but then today he is having nausea and vomiting and abd pain and just doesn't feel good.   He is shaking all over. 3. FLUIDS: "What fluids or food have you vomited up today?" "Have you been able to keep any fluids down?"     Yes vomiting today 4. ABDOMEN PAIN: "Are your having any abdomen pain?" If Yes : "How bad is it and what does it feel like?" (e.g., crampy, dull, intermittent, constant)      Yes 5. DIARRHEA: "Is there any diarrhea?" If Yes, ask: "How many times today?"      No    He has been constipated since the procedure. 6. CONTACTS: "Is there anyone else in the family with the same symptoms?"      Not asked 7. CAUSE: "What do you think is causing your vomiting?"     Not sure if it's a virus or something from the colonoscopy 8. HYDRATION STATUS: "Any signs of dehydration?" (e.g., dry mouth [not only dry lips], too weak to  stand) "When did you last urinate?"     Not asked 9. OTHER SYMPTOMS: "Do you have any other symptoms?" (e.g., fever, headache, vertigo, vomiting blood or coffee grounds, recent head injury)     Shaking, abd pain,  10. PREGNANCY: "Is there any chance you are pregnant?" "When was your last menstrual period?"       N/A  Protocols used: Vomiting-A-AH

## 2022-12-05 ENCOUNTER — Encounter (HOSPITAL_COMMUNITY): Payer: Self-pay | Admitting: Internal Medicine

## 2023-03-24 DIAGNOSIS — Z419 Encounter for procedure for purposes other than remedying health state, unspecified: Secondary | ICD-10-CM | POA: Diagnosis not present

## 2023-04-08 DIAGNOSIS — M5451 Vertebrogenic low back pain: Secondary | ICD-10-CM | POA: Diagnosis not present

## 2023-04-08 DIAGNOSIS — G894 Chronic pain syndrome: Secondary | ICD-10-CM | POA: Diagnosis not present

## 2023-04-08 DIAGNOSIS — M25569 Pain in unspecified knee: Secondary | ICD-10-CM | POA: Diagnosis not present

## 2023-04-08 DIAGNOSIS — Z79891 Long term (current) use of opiate analgesic: Secondary | ICD-10-CM | POA: Diagnosis not present

## 2023-04-22 DIAGNOSIS — G4730 Sleep apnea, unspecified: Secondary | ICD-10-CM | POA: Diagnosis not present

## 2023-04-22 DIAGNOSIS — K7460 Unspecified cirrhosis of liver: Secondary | ICD-10-CM | POA: Diagnosis not present

## 2023-04-22 DIAGNOSIS — Z6841 Body Mass Index (BMI) 40.0 and over, adult: Secondary | ICD-10-CM | POA: Diagnosis not present

## 2023-04-22 DIAGNOSIS — M160 Bilateral primary osteoarthritis of hip: Secondary | ICD-10-CM | POA: Diagnosis not present

## 2023-04-22 DIAGNOSIS — R0781 Pleurodynia: Secondary | ICD-10-CM | POA: Diagnosis not present

## 2023-04-22 DIAGNOSIS — M79603 Pain in arm, unspecified: Secondary | ICD-10-CM | POA: Diagnosis not present

## 2023-04-22 DIAGNOSIS — E669 Obesity, unspecified: Secondary | ICD-10-CM | POA: Diagnosis not present

## 2023-04-22 DIAGNOSIS — E119 Type 2 diabetes mellitus without complications: Secondary | ICD-10-CM | POA: Diagnosis not present

## 2023-04-22 DIAGNOSIS — M51369 Other intervertebral disc degeneration, lumbar region without mention of lumbar back pain or lower extremity pain: Secondary | ICD-10-CM | POA: Diagnosis not present

## 2023-04-22 DIAGNOSIS — W1839XA Other fall on same level, initial encounter: Secondary | ICD-10-CM | POA: Diagnosis not present

## 2023-04-22 DIAGNOSIS — Z87891 Personal history of nicotine dependence: Secondary | ICD-10-CM | POA: Diagnosis not present

## 2023-04-22 DIAGNOSIS — R0789 Other chest pain: Secondary | ICD-10-CM | POA: Diagnosis not present

## 2023-04-22 DIAGNOSIS — K219 Gastro-esophageal reflux disease without esophagitis: Secondary | ICD-10-CM | POA: Diagnosis not present

## 2023-04-22 DIAGNOSIS — Z886 Allergy status to analgesic agent status: Secondary | ICD-10-CM | POA: Diagnosis not present

## 2023-04-24 DIAGNOSIS — Z419 Encounter for procedure for purposes other than remedying health state, unspecified: Secondary | ICD-10-CM | POA: Diagnosis not present

## 2023-05-02 DIAGNOSIS — M25572 Pain in left ankle and joints of left foot: Secondary | ICD-10-CM | POA: Diagnosis not present

## 2023-05-02 DIAGNOSIS — K7460 Unspecified cirrhosis of liver: Secondary | ICD-10-CM | POA: Diagnosis not present

## 2023-05-02 DIAGNOSIS — R0789 Other chest pain: Secondary | ICD-10-CM | POA: Diagnosis not present

## 2023-05-02 DIAGNOSIS — D696 Thrombocytopenia, unspecified: Secondary | ICD-10-CM | POA: Diagnosis not present

## 2023-05-02 DIAGNOSIS — Z6841 Body Mass Index (BMI) 40.0 and over, adult: Secondary | ICD-10-CM | POA: Diagnosis not present

## 2023-05-11 DIAGNOSIS — Z419 Encounter for procedure for purposes other than remedying health state, unspecified: Secondary | ICD-10-CM | POA: Diagnosis not present

## 2023-05-20 ENCOUNTER — Encounter: Payer: Self-pay | Admitting: Internal Medicine

## 2023-05-28 DIAGNOSIS — M25569 Pain in unspecified knee: Secondary | ICD-10-CM | POA: Diagnosis not present

## 2023-05-28 DIAGNOSIS — Z79891 Long term (current) use of opiate analgesic: Secondary | ICD-10-CM | POA: Diagnosis not present

## 2023-05-28 DIAGNOSIS — M5451 Vertebrogenic low back pain: Secondary | ICD-10-CM | POA: Diagnosis not present

## 2023-05-28 DIAGNOSIS — M25559 Pain in unspecified hip: Secondary | ICD-10-CM | POA: Diagnosis not present

## 2023-05-28 DIAGNOSIS — G894 Chronic pain syndrome: Secondary | ICD-10-CM | POA: Diagnosis not present

## 2023-05-31 DIAGNOSIS — Z6841 Body Mass Index (BMI) 40.0 and over, adult: Secondary | ICD-10-CM | POA: Diagnosis not present

## 2023-05-31 DIAGNOSIS — E669 Obesity, unspecified: Secondary | ICD-10-CM | POA: Diagnosis not present

## 2023-05-31 DIAGNOSIS — M25551 Pain in right hip: Secondary | ICD-10-CM | POA: Diagnosis not present

## 2023-05-31 DIAGNOSIS — M160 Bilateral primary osteoarthritis of hip: Secondary | ICD-10-CM | POA: Diagnosis not present

## 2023-05-31 DIAGNOSIS — M25552 Pain in left hip: Secondary | ICD-10-CM | POA: Diagnosis not present

## 2023-06-03 ENCOUNTER — Encounter (INDEPENDENT_AMBULATORY_CARE_PROVIDER_SITE_OTHER): Payer: Self-pay

## 2023-06-19 DIAGNOSIS — D696 Thrombocytopenia, unspecified: Secondary | ICD-10-CM | POA: Diagnosis not present

## 2023-06-19 DIAGNOSIS — Z6841 Body Mass Index (BMI) 40.0 and over, adult: Secondary | ICD-10-CM | POA: Diagnosis not present

## 2023-06-19 DIAGNOSIS — K7460 Unspecified cirrhosis of liver: Secondary | ICD-10-CM | POA: Diagnosis not present

## 2023-06-19 DIAGNOSIS — R0789 Other chest pain: Secondary | ICD-10-CM | POA: Diagnosis not present

## 2023-06-19 DIAGNOSIS — M25572 Pain in left ankle and joints of left foot: Secondary | ICD-10-CM | POA: Diagnosis not present

## 2023-06-22 DIAGNOSIS — Z419 Encounter for procedure for purposes other than remedying health state, unspecified: Secondary | ICD-10-CM | POA: Diagnosis not present

## 2023-06-24 DIAGNOSIS — G894 Chronic pain syndrome: Secondary | ICD-10-CM | POA: Diagnosis not present

## 2023-06-24 DIAGNOSIS — M5451 Vertebrogenic low back pain: Secondary | ICD-10-CM | POA: Diagnosis not present

## 2023-06-24 DIAGNOSIS — M25569 Pain in unspecified knee: Secondary | ICD-10-CM | POA: Diagnosis not present

## 2023-06-24 DIAGNOSIS — Z79891 Long term (current) use of opiate analgesic: Secondary | ICD-10-CM | POA: Diagnosis not present

## 2023-06-25 NOTE — Progress Notes (Unsigned)
 Referring Provider: Donetta Potts, MD Primary Care Physician:  Donetta Potts, MD Primary GI Physician: Dr. Marletta Lor  No chief complaint on file.   HPI:   Douglas Branch is a 48 y.o. male with history of alcoholic cirrhosis +/- MASH  diagnosed in 2021 after work-up for thrombocytopenia, significant alcohol abuse previously, presenting today for ***  Previous serological workup was negative for viral hepatitis, A1AT deficiency, Wilson disease, Hereditary hemochromatosis and autoimmune hepatitis.     Today;  Ascites/peripheral edema:  Diuretics:  Paracentesis:  History of SBP:  Encephalopathy:     MELD 3.0: 8 on 11/22/22.  [place]: 11/02/2022 with nodular heterogenous echogenic liver.  Median K PA 5.8, but reduced accuracy of test.  No focal liver lesion. AFP: 1.9 on 11/22/2022 Hep A/B vaccination: EGD: 11/26/22 without varices or PHG BB: No***   Colonoscopy 11/26/2022:.  No hemorrhoids, nonbleeding internal hemorrhoids, two 4-6 mm polyps resected and retrieved, one 12 mm polyp resected and retrieved s/p MR conditional clipping.  Recommended 3-year surveillance. Pathology with tubular adenomas.  EGD 11/26/2022: Gastritis, otherwise normal exam.  Recommended repeat in 3 years for screening purposes.   Past Medical History:  Diagnosis Date   Allergy    seasonal    Arthritis    hips, knee   DDD (degenerative disc disease), lumbar    GERD (gastroesophageal reflux disease)    PONV (postoperative nausea and vomiting)    Sleep apnea     Past Surgical History:  Procedure Laterality Date   COLONOSCOPY WITH PROPOFOL N/A 11/26/2022   Procedure: COLONOSCOPY WITH PROPOFOL;  Surgeon: Lanelle Bal, DO;  [facility]: [facility];  Service: Endoscopy;  Laterality: N/A;  9:45 am, asa 3, pt knows to arrive at 6:30   ESOPHAGOGASTRODUODENOSCOPY (EGD) WITH PROPOFOL N/A 11/26/2022   Procedure: ESOPHAGOGASTRODUODENOSCOPY (EGD) WITH PROPOFOL;  Surgeon: Lanelle Bal, DO;  Location:  [facility];  Service: Endoscopy;  Laterality: N/A;   HEMOSTASIS CLIP PLACEMENT  11/26/2022   Procedure: HEMOSTASIS CLIP PLACEMENT;  Surgeon: Lanelle Bal, DO;  Location: [facility];  Service: Endoscopy;;   POLYPECTOMY  11/26/2022   Procedure: POLYPECTOMY;  Surgeon: Lanelle Bal, DO;  Location: [facility];  Service: Endoscopy;;   ROTATOR CUFF REPAIR Left 2010   TONSILLECTOMY     WISDOM TOOTH EXTRACTION      Current Outpatient Medications  Medication Sig Dispense Refill   glucosamine-chondroitin 500-400 MG tablet Take 2 tablets by mouth daily.     Multiple Vitamin (MULTIVITAMIN WITH MINERALS) TABS tablet Take 1 tablet by mouth daily.     oxyCODONE (OXY IR/ROXICODONE) 5 MG immediate release tablet Take 5 mg by mouth every 4 (four) hours as needed for severe pain.     tamsulosin (FLOMAX) 0.4 MG CAPS capsule Take 0.4 mg by mouth.     No current facility-administered medications for this visit.    Allergies as of 06/27/2023 - Review Complete 11/26/2022  Allergen Reaction Noted   Pollen extract Itching 11/20/2017    Family History  Problem Relation Age of Onset   Hyperlipidemia Mother    Hypertension Mother    Diabetes Mother    Heart attack Mother 92       CABG   Hypertension Father    Hyperlipidemia Father    Diabetes Father    Gout Paternal Uncle    Diabetes Maternal Grandmother    Hyperlipidemia Maternal Grandmother    Hypertension Maternal Grandmother    Diabetes Maternal Grandfather  Hyperlipidemia Maternal Grandfather    Hypertension Maternal Grandfather    Diabetes Paternal Grandmother    Hyperlipidemia Paternal Grandmother    Emphysema Paternal Grandmother    Hypertension Paternal Grandmother    Diabetes Paternal Grandfather    Hyperlipidemia Paternal Grandfather    Hypertension Paternal Grandfather     Social History   Socioeconomic History   Marital status: Divorced    Spouse name: Not on file   Number of children: 3   Years of  education: Not on file   Highest education level: Not on file  Occupational History   Not on file  Tobacco Use   Smoking status: Former   Smokeless tobacco: Never  Vaping Use   Vaping status: Never Used  Substance and Sexual Activity   Alcohol use: No   Drug use: Yes    Types: Marijuana   Sexual activity: Not on file  Other Topics Concern   Not on file  Social History Narrative   Lives with 4 children.    Social Drivers of Corporate investment banker Strain: Low Risk  (04/07/2021)   Received [facility]   Overall Financial Resource Strain (CARDIA)    Difficulty of Paying Living Expenses: Not hard at all  Food Insecurity: No Food Insecurity (04/07/2021)   Received from [facility]   Hunger Vital Sign    Worried About Running Out of Food in the Last Year: Never true    Ran Out of Food in the Last Year: Never true  Transportation Needs: No Transportation Needs (04/07/2021)   Received from [facility] - Transportation    Lack of Transportation (Medical): No    Lack of Transportation (Non-Medical): No  Physical Activity: Inactive (04/07/2021)   Received from [facility]   Exercise Vital Sign    Days of Exercise per Week: 0 days    Minutes of Exercise per Session: 0 min  Stress: No Stress Concern Present (04/07/2021)   Received from [facility] - Occupational Stress Questionnaire    Feeling of Stress : Not at all  Social Connections: Socially Isolated (04/07/2021)   Received from [facility]   Social Connection and Isolation Panel [NHANES]    Frequency of Communication with Friends and Family: More than three times a week    Frequency of Social Gatherings with Friends and Family: Twice a week    Attends Religious Services: Never    Database administrator or Organizations: No    Attends Engineer, structural: Never    Marital Status: Never married    Review of Systems: Gen: Denies  fever, chills, anorexia. Denies fatigue, weakness, weight loss.  CV: Denies chest pain, palpitations, syncope, peripheral edema, and claudication. Resp: Denies dyspnea at rest, cough, wheezing, coughing up blood, and pleurisy. GI: Denies vomiting blood, jaundice, and fecal incontinence.   Denies dysphagia or odynophagia. Derm: Denies rash, itching, dry skin Psych: Denies depression, anxiety, memory loss, confusion. No homicidal or suicidal ideation.  Heme: Denies bruising, bleeding, and enlarged lymph nodes.  Physical Exam: There were no vitals taken for this visit. General:   Alert and oriented. No distress noted. Pleasant and cooperative.  Head:  Normocephalic and atraumatic. Eyes:  Conjuctiva clear without scleral icterus. Heart:  S1, S2 present without murmurs appreciated. Lungs:  Clear to auscultation bilaterally. No wheezes, rales, or rhonchi. No distress.  Abdomen:  +BS, soft, non-tender and non-distended. No  rebound or guarding. No HSM or masses noted. Msk:  Symmetrical without gross deformities. Normal posture. Extremities:  Without edema. Neurologic:  Alert and  oriented x4 Psych:  Normal mood and affect.    Assessment:     Plan:  ***   Shana Daring, PA-C [facility] 06/27/2023

## 2023-06-26 ENCOUNTER — Encounter: Payer: Self-pay | Admitting: Bariatrics

## 2023-06-26 ENCOUNTER — Ambulatory Visit: Admitting: Bariatrics

## 2023-06-26 VITALS — BP 156/72 | HR 78 | Temp 98.4°F | Ht 70.0 in | Wt 317.0 lb

## 2023-06-26 DIAGNOSIS — Z6841 Body Mass Index (BMI) 40.0 and over, adult: Secondary | ICD-10-CM

## 2023-06-26 DIAGNOSIS — M160 Bilateral primary osteoarthritis of hip: Secondary | ICD-10-CM | POA: Diagnosis not present

## 2023-06-26 DIAGNOSIS — K7469 Other cirrhosis of liver: Secondary | ICD-10-CM | POA: Diagnosis not present

## 2023-06-26 NOTE — Progress Notes (Signed)
 Office: [phone]  /  Fax: [phone]   Initial Visit  Douglas Branch was seen in clinic today to evaluate for obesity. He is interested in losing weight to improve overall health and reduce the risk of weight related complications. He presents today to review program treatment options, initial physical assessment, and evaluation.     He was referred by: Specialist ([facility])  When asked what else they would like to accomplish? He states: Adopt healthier eating patterns, Improve energy levels and physical activity, Improve existing medical conditions, and Improve quality of life  When asked how has your weight affected you? He states: Contributed to medical problems, Contributed to orthopedic problems or mobility issues, Having fatigue, and Having poor endurance  Some associated conditions: Arthritis:bilateral hips, OSA, Vitamin D Deficiency, and Other: Fatty liver-cirrohous,   Contributing factors: Family history of obesity and Slow metabolism for age  Weight promoting medications identified: None  Current nutrition plan: None, Low-carb, High-protein, and Portion control / smart choices.  He states that he needs to eat approximately every 3 hours small more frequent meals.  Current level of physical activity: use walker on a regular basis.   Current or previous pharmacotherapy: None  Response to medication: Never tried medications   Past medical history includes:   Past Medical History:  Diagnosis Date   Allergy    seasonal    Arthritis    hips, knee   DDD (degenerative disc disease), lumbar    GERD (gastroesophageal reflux disease)    PONV (postoperative nausea and vomiting)    Sleep apnea      Objective:   BP (!) 156/72 Comment: patient states he is in pain today  Pulse 78   Temp 98.4 F (36.9 C)   Ht 5\' 10"  (1.778 m)   Wt (!) 317 lb (143.8 kg)   SpO2 96%   BMI 45.48 kg/m  He was weighed on the bioimpedance scale: Body mass index is 45.48 kg/m.   Peak Weight:485 lbs , Body Fat%:no weight strip, Visceral Fat Rating: unable to get weight strip, Weight trend over the last 12 months: Decreasing  General:  Alert, oriented and cooperative. Patient is in no acute distress.  Respiratory: Normal respiratory effort, no problems with respiration noted  Extremities: Normal range of motion.    Mental Status: Normal mood and affect. Normal behavior. Normal judgment and thought content.   DIAGNOSTIC DATA REVIEWED:  BMET    Component Value Date/Time   NA 134 (L) 11/22/2022 0951   NA 140 12/03/2018 0810   K 4.1 11/22/2022 0951   CL 101 11/22/2022 0951   CO2 23 11/22/2022 0951   GLUCOSE 101 (H) 11/22/2022 0951   BUN 18 11/22/2022 0951   BUN 11 12/03/2018 0810   CREATININE 0.59 (L) 11/22/2022 0951   CALCIUM 9.1 11/22/2022 0951   GFRNONAA >60 11/22/2022 0951   GFRAA 121 12/03/2018 0810   Lab Results  Component Value Date   HGBA1C 5.6 12/02/2018   No results found for: "INSULIN" CBC    Component Value Date/Time   WBC 5.0 11/22/2022 0951   RBC 4.58 11/22/2022 0951   HGB 14.1 11/22/2022 0951   HGB 13.9 12/03/2018 0810   HGB 13.9 12/03/2018 0810   HCT 42.0 11/22/2022 0951   HCT 40.2 12/03/2018 0810   HCT 40.2 12/03/2018 0810   PLT 100 (L) 11/22/2022 0951   PLT 107 (L) 12/03/2018 0810   PLT 103 (L) 12/03/2018 0810   MCV 91.7 11/22/2022 0951   MCV 94  12/03/2018 0810   MCV 95 12/03/2018 0810   MCH 30.8 11/22/2022 0951   MCHC 33.6 11/22/2022 0951   RDW 13.1 11/22/2022 0951   RDW 12.9 12/03/2018 0810   RDW 12.9 12/03/2018 0810   Iron/TIBC/Ferritin/ %Sat No results found for: "IRON", "TIBC", "FERRITIN", "IRONPCTSAT" Lipid Panel     Component Value Date/Time   CHOL 224 (H) 12/03/2018 0810   TRIG 107 12/03/2018 0810   HDL 71 12/03/2018 0810   CHOLHDL 3.2 12/03/2018 0810   LDLCALC 134 (H) 12/03/2018 0810   Hepatic Function Panel     Component Value Date/Time   PROT 7.4 11/22/2022 0951   PROT 7.2 12/03/2018 0810   ALBUMIN  4.1 11/22/2022 0951   ALBUMIN 4.0 12/03/2018 0810   AST 29 11/22/2022 0951   ALT 24 11/22/2022 0951   ALKPHOS 68 11/22/2022 0951   BILITOT 0.7 11/22/2022 0951   BILITOT 0.8 12/03/2018 0810      Component Value Date/Time   TSH 1.520 12/03/2018 0810     Assessment and Plan:    Primary osteoarthritis of both hips:   He has a history of primary osteoarthritis of both hips and is seeing an orthopedist.  He states that he needs to get his BMI below 40 so that he can get bilateral hip replacements.  His arthritis limits his motion and he walks with a walker.  Noted an antalgic walk with limited mobility.  Plan:   He will follow-up with his orthopedist.  He will begin to work on the plan and will work toward achieving a BMI of 40 or less.   Cirrhosis of the liver:  He states that he has a history of fatty liver/cirrhosis of the liver.  He states that he has had an ultrasound in the past to confirm this and that he is getting a repeat ultrasound in the near future per his GI.   Plan: He will follow-up with his GI for a repeat ultrasound of the liver.     Morbid Obesity: Current BMI 45.48    Obesity Treatment / Action Plan:  Patient will work on garnering support from family and friends to begin weight loss journey. Will work on eliminating or reducing the presence of highly palatable, calorie dense foods in the home. Will complete provided nutritional and psychosocial assessment questionnaire before the next appointment. Will be scheduled for indirect calorimetry to determine resting energy expenditure in a fasting state.  This will allow [place] to create a reduced calorie, high-protein meal plan to promote loss of fat mass while preserving muscle mass. Counseled on the health benefits of losing 5%-15% of total body weight. Was counseled on nutritional approaches to weight loss and benefits of reducing processed foods and consuming plant-based foods and high quality protein as part of  nutritional weight management. Was counseled on pharmacotherapy and role as an adjunct in weight management.   Obesity Education Performed Today:  He was weighed on the bioimpedance scale and results were discussed and documented in the synopsis.  We discussed obesity as a disease and the importance of a more detailed evaluation of all the factors contributing to the disease.  We discussed the importance of long term lifestyle changes which include nutrition, exercise and behavioral modifications as well as the importance of customizing this to his specific health and social needs.  We discussed the benefits of reaching a healthier weight to alleviate the symptoms of existing conditions and reduce the risks of the biomechanical, metabolic and psychological effects of  obesity.  Discussed New Patient/Late Arrival, and Cancellation Policies. Patient voiced understanding and allowed to ask questions.   Jahari Rasor appears to be in the action stage of change and states they are ready to start intensive lifestyle modifications and behavioral modifications.  38 minutes was spent today on this visit including the above counseling, pre-visit chart review, and post-visit documentation.  Reviewed by clinician on day of visit: allergies, medications, problem list, medical history, surgical history, family history, social history, and previous encounter notes.    Karna Abed A. Claiborne CrewO.

## 2023-06-27 ENCOUNTER — Ambulatory Visit (INDEPENDENT_AMBULATORY_CARE_PROVIDER_SITE_OTHER): Admitting: Gastroenterology

## 2023-06-27 ENCOUNTER — Encounter: Payer: Self-pay | Admitting: Gastroenterology

## 2023-06-27 ENCOUNTER — Encounter: Payer: Self-pay | Admitting: *Deleted

## 2023-06-27 VITALS — BP 154/89 | HR 68 | Temp 97.7°F | Ht 71.0 in | Wt 288.0 lb

## 2023-06-27 DIAGNOSIS — F1011 Alcohol abuse, in remission: Secondary | ICD-10-CM

## 2023-06-27 DIAGNOSIS — K7030 Alcoholic cirrhosis of liver without ascites: Secondary | ICD-10-CM | POA: Diagnosis not present

## 2023-06-27 DIAGNOSIS — R600 Localized edema: Secondary | ICD-10-CM

## 2023-06-27 DIAGNOSIS — K5903 Drug induced constipation: Secondary | ICD-10-CM | POA: Diagnosis not present

## 2023-06-27 DIAGNOSIS — T402X5A Adverse effect of other opioids, initial encounter: Secondary | ICD-10-CM

## 2023-06-27 NOTE — Patient Instructions (Addendum)
 We will arrange for you to have an ultrasound of your liver at [facility].   Please have labs completed at [facility] when you go to have your ultrasound  Cirrhosis nutrition recommendations:  High-protein diet from a primarily plant-based diet. Avoid red meat.  No raw or undercooked meat, seafood, or shellfish. Low-fat/cholesterol/carbohydrate diet. Limit sodium to no more than 2000 mg/day including everything that you eat and drink. Recommend at least 30 minutes of aerobic and resistance exercise 3 days/week as you are able.   Start Linzess 145 mcg daily, for thing in the morning, at least 30 minutes before meal for constipation.  We are providing you with samples today.  Please call or send a MyChart message with a progress report next week.  If it works well, I will send in a prescription for you.  I will plan to see back in 6 months or sooner if needed.  Shana Daring, PA-C [facility]

## 2023-07-10 ENCOUNTER — Ambulatory Visit (HOSPITAL_COMMUNITY)
Admission: RE | Admit: 2023-07-10 | Discharge: 2023-07-10 | Disposition: A | Discharge status: Home / Self Care | Source: Ambulatory Visit | Attending: Gastroenterology | Admitting: Gastroenterology

## 2023-07-10 DIAGNOSIS — K7030 Alcoholic cirrhosis of liver without ascites: Secondary | ICD-10-CM | POA: Insufficient documentation

## 2023-07-22 DIAGNOSIS — Z419 Encounter for procedure for purposes other than remedying health state, unspecified: Secondary | ICD-10-CM | POA: Diagnosis not present

## 2023-07-25 ENCOUNTER — Ambulatory Visit: Payer: Self-pay | Admitting: *Deleted

## 2023-07-29 DIAGNOSIS — M25569 Pain in unspecified knee: Secondary | ICD-10-CM | POA: Diagnosis not present

## 2023-07-29 DIAGNOSIS — G894 Chronic pain syndrome: Secondary | ICD-10-CM | POA: Diagnosis not present

## 2023-07-29 DIAGNOSIS — Z79891 Long term (current) use of opiate analgesic: Secondary | ICD-10-CM | POA: Diagnosis not present

## 2023-07-29 DIAGNOSIS — M5451 Vertebrogenic low back pain: Secondary | ICD-10-CM | POA: Diagnosis not present

## 2023-08-22 DIAGNOSIS — Z419 Encounter for procedure for purposes other than remedying health state, unspecified: Secondary | ICD-10-CM | POA: Diagnosis not present

## 2023-08-26 DIAGNOSIS — Z79891 Long term (current) use of opiate analgesic: Secondary | ICD-10-CM | POA: Diagnosis not present

## 2023-08-26 DIAGNOSIS — G894 Chronic pain syndrome: Secondary | ICD-10-CM | POA: Diagnosis not present

## 2023-08-26 DIAGNOSIS — M25569 Pain in unspecified knee: Secondary | ICD-10-CM | POA: Diagnosis not present

## 2023-08-26 DIAGNOSIS — M5451 Vertebrogenic low back pain: Secondary | ICD-10-CM | POA: Diagnosis not present

## 2023-09-21 DIAGNOSIS — Z419 Encounter for procedure for purposes other than remedying health state, unspecified: Secondary | ICD-10-CM | POA: Diagnosis not present

## 2023-09-23 DIAGNOSIS — G894 Chronic pain syndrome: Secondary | ICD-10-CM | POA: Diagnosis not present

## 2023-09-23 DIAGNOSIS — M25569 Pain in unspecified knee: Secondary | ICD-10-CM | POA: Diagnosis not present

## 2023-09-23 DIAGNOSIS — M25559 Pain in unspecified hip: Secondary | ICD-10-CM | POA: Diagnosis not present

## 2023-09-23 DIAGNOSIS — M5451 Vertebrogenic low back pain: Secondary | ICD-10-CM | POA: Diagnosis not present

## 2023-09-30 DIAGNOSIS — M25552 Pain in left hip: Secondary | ICD-10-CM | POA: Diagnosis not present

## 2023-09-30 DIAGNOSIS — M160 Bilateral primary osteoarthritis of hip: Secondary | ICD-10-CM | POA: Diagnosis not present

## 2023-09-30 DIAGNOSIS — E669 Obesity, unspecified: Secondary | ICD-10-CM | POA: Diagnosis not present

## 2023-09-30 DIAGNOSIS — Z6841 Body Mass Index (BMI) 40.0 and over, adult: Secondary | ICD-10-CM | POA: Diagnosis not present

## 2023-09-30 DIAGNOSIS — M25551 Pain in right hip: Secondary | ICD-10-CM | POA: Diagnosis not present

## 2023-10-09 ENCOUNTER — Telehealth: Payer: Self-pay

## 2023-10-09 NOTE — Telephone Encounter (Signed)
 Our office manager looked in to this and saw that there is a dismissal on the acct due to an outstanding bad debt balance of $458.00 for DOS 12/02/2018. Looks like he came in for a visit and didn't have insurance at the time so we gave him a [employer] and he has not paid anything towards it. Patient voiced understanding.    Copied from CRM [id number]. Topic: Appointments - Appointment Scheduling >> Oct 09, 2023 10:14 AM Douglas Branch wrote: Spoke with Mitzie at the office, pt has a block on acct and can not schedule appt. She is going to check into it and give patient a call back if she can schedule appt,.

## 2023-10-14 DIAGNOSIS — Z0289 Encounter for other administrative examinations: Secondary | ICD-10-CM

## 2023-10-22 DIAGNOSIS — M25569 Pain in unspecified knee: Secondary | ICD-10-CM | POA: Diagnosis not present

## 2023-10-22 DIAGNOSIS — Z79891 Long term (current) use of opiate analgesic: Secondary | ICD-10-CM | POA: Diagnosis not present

## 2023-10-22 DIAGNOSIS — M25559 Pain in unspecified hip: Secondary | ICD-10-CM | POA: Diagnosis not present

## 2023-10-22 DIAGNOSIS — Z419 Encounter for procedure for purposes other than remedying health state, unspecified: Secondary | ICD-10-CM | POA: Diagnosis not present

## 2023-10-22 DIAGNOSIS — G894 Chronic pain syndrome: Secondary | ICD-10-CM | POA: Diagnosis not present

## 2023-10-22 DIAGNOSIS — M5451 Vertebrogenic low back pain: Secondary | ICD-10-CM | POA: Diagnosis not present

## 2023-11-05 ENCOUNTER — Encounter: Payer: Self-pay | Admitting: Bariatrics

## 2023-11-05 ENCOUNTER — Ambulatory Visit: Admitting: Bariatrics

## 2023-11-05 VITALS — BP 150/90 | HR 52 | Temp 97.8°F | Ht 70.0 in | Wt 329.0 lb

## 2023-11-05 DIAGNOSIS — R030 Elevated blood-pressure reading, without diagnosis of hypertension: Secondary | ICD-10-CM

## 2023-11-05 DIAGNOSIS — Z0000 Encounter for general adult medical examination without abnormal findings: Secondary | ICD-10-CM

## 2023-11-05 DIAGNOSIS — R0602 Shortness of breath: Secondary | ICD-10-CM | POA: Diagnosis not present

## 2023-11-05 DIAGNOSIS — Z1331 Encounter for screening for depression: Secondary | ICD-10-CM | POA: Diagnosis not present

## 2023-11-05 DIAGNOSIS — M160 Bilateral primary osteoarthritis of hip: Secondary | ICD-10-CM | POA: Diagnosis not present

## 2023-11-05 DIAGNOSIS — R5383 Other fatigue: Secondary | ICD-10-CM

## 2023-11-05 DIAGNOSIS — E559 Vitamin D deficiency, unspecified: Secondary | ICD-10-CM

## 2023-11-05 DIAGNOSIS — Z6841 Body Mass Index (BMI) 40.0 and over, adult: Secondary | ICD-10-CM

## 2023-11-05 DIAGNOSIS — G4733 Obstructive sleep apnea (adult) (pediatric): Secondary | ICD-10-CM | POA: Diagnosis not present

## 2023-11-05 DIAGNOSIS — K7469 Other cirrhosis of liver: Secondary | ICD-10-CM | POA: Diagnosis not present

## 2023-11-05 NOTE — Progress Notes (Signed)
 At a Glance:  Vitals Temp: 97.8 F (36.6 C) BP: (!) 150/90 Pulse Rate: (!) 52 SpO2: 96 %   Anthropometric Measurements Height: 5' 10 (1.778 m) Weight: (!) 329 lb (149.2 kg) BMI (Calculated): 47.21 Starting Weight: 329lb   No data recorded Other Clinical Data RMR: 3139 Fasting: yes Labs: yes Today's Visit #: 1 Starting Date: 11/05/23    EKG: Normal sinus rhythm, rate 52.   Indirect Calorimeter:   Resting Metabolic Rate ( RMR):  RMR (actual): 3139 kcal RMR (calculated): Unable to obtain secondary to mobility and balance issues.  Plan:  Indirect calorimeter completed, interpreted and reviewed with patient today and allowed to ask questions.  Discussed the implications for the chosen plan and exercise based on the RMR reading.  Will consider repeating the RMR in the future based on weight loss.    Chief Complaint:  Obesity   Subjective:  Douglas Branch (MR# [id number]) is a 48 y.o. male who presents for evaluation and treatment of obesity and related comorbidities.   Douglas Branch is currently in the action stage of change and ready to dedicate time achieving and maintaining a healthier weight. Douglas Branch is interested in becoming our patient and working on intensive lifestyle modifications including (but not limited to) diet and exercise for weight loss.  Douglas Branch has been struggling with his weight. He has been unsuccessful in either losing weight, maintaining weight loss, or reaching his healthy weight goal.  Douglas Branch's habits were reviewed today and are as follows: His family eats meals together, he thinks his family will eat healthier with him, and he started gaining weight in middle school. He occasionally skips meals. He denies eating large portions.    Current or previous pharmacotherapy: None  Response to medication: Never tried medications  Other Fatigue Douglas Branch admits to daytime somnolence and admits to waking up still tired. Douglas Branch generally gets 8 or 9  hours of sleep per night, and states that he has difficulty falling back asleep if awakened. Snoring is present. Apneic episodes is not present. Epworth Sleepiness Score is 3.   Shortness of Breath Douglas Branch notes increasing shortness of breath with exercising and seems to be worsening over time with weight gain. He notes getting out of breath sooner with activity than he used to. This has not gotten worse recently. Douglas Branch denies shortness of breath at rest or orthopnea.  Depression Screen Douglas Branch's Food and Mood (modified PHQ-9) score was 7. 5-9 mild depression     12/02/2018    1:17 PM  Depression screen PHQ 2/9  Decreased Interest 0  Down, Depressed, Hopeless 0  PHQ - 2 Score 0     Assessment and Plan:   Other Fatigue Douglas Branch does feel that his weight is causing his energy to be lower than it should be. Fatigue may be related to obesity, depression or many other causes. Labs will be ordered, and in the meanwhile, Douglas Branch will focus on self care including making healthy food choices, increasing physical activity and focusing on stress reduction.  Shortness of Breath Douglas Branch does not feel that he gets out of breath more easily that he used to when he exercises. Douglas Branch's shortness of breath appears to be obesity related and exercise induced. He has agreed to work on weight loss and gradually increase exercise to treat his exercise induced shortness of breath. Will continue to monitor closely.  Health Maintenance:   Obesity   Plan: Will do EKG, indirect calorimetry, and labs.     Vitamin D  Deficiency Vitamin D   is not at goal of 50.  Most recent vitamin D  level was 31.62. He is at risk for vitamin D  deficiency due to obesity.  He is on vitamin D  OTC.  Lab Results  Component Value Date   VD25OH 31.62 11/22/2022   VD25OH 19.8 (L) 12/03/2018    Plan: Continue vitamin D  over-the-counter. Will check for vitamin D  deficiency.   Douglas Branch had a positive depression screening. Depression is  commonly associated with obesity and often results in emotional eating behaviors. We will monitor this closely and work on CBT to help improve the non-hunger eating patterns. Referral to Psychology may be required if no improvement is seen as he continues in our clinic.    Other Cirrhosis of the liver:   He states that he has a history of fatty liver/cirrhosis of the liver.  He states that he has had an ultrasound in the past to confirm this and that he is getting a repeat ultrasound in the near future per his GI. AT his last visit, he stated that he was going to have a repeat US  of the liver.  He states that this is improving. He is not drinking alcohol.   Plan:  He will begin the plan and will do some upper body exercises.   Primary OA of both hips:    He has a history of primary osteoarthritis of both hips and is seeing an orthopedist.  He states that he needs to get his BMI below 40 so that he can get bilateral hip replacements.  His arthritis limits his motion and he walks with a walker.  Noted an antalgic walk with limited mobility.  Plan:  He will follow-up with pain management.  He will do upper body exercises.  He will begin the plan and exercise.   Elevated blood pressure without diagnosis of hypertension Blood pressure is noted to be borderline elevated today, but normal in the past. Douglas Branch denies chest pains or SOB. We checked his blood pressure x 2 today.  BP Readings from Last 3 Encounters:  11/05/23 (!) 150/90  06/27/23 (!) 154/89  06/26/23 (!) 156/72    Plan: Continue to monitor.  If blood pressure continues to be elevated at future office visits, will consider starting antihypertensive medication. Patient will not use any added salt.    Obstructive Sleep Apnea Douglas Branch has a diagnosis of sleep apnea. He reports that he is using a CPAP regularly. Reports restful sleep except getting up to urinate and because of chronic pain.  Plan: Continue CPAP therapy. Continue  to practice good sleep hygiene.  Will add in elements of an antiinflammatory diet and  add in elements of a Mediterranean diet.  Reduce intake of carbohydrates for weight loss.   Previous labs reviewed today. Date: 11/22/22 CMP, Vit D, and CBC, and glucose  Labs done today CMP, Lipids, Insulin , HgbA1c, Vit D, Thyroid Panel, and CBC   Morbid Obesity: BMI (Calculated): 47.21   Douglas Branch is currently in the action stage of change and his goal is to begin weight loss efforts. I recommend Douglas Branch begin the structured treatment plan as follows:  He has agreed to Category 4 Plan and keeping a food journal and adhering to recommended goals of 1,800 to 2,000 calories and 100 to 140 grams of  protein  Exercise goals: All adults should avoid inactivity. Some activity is better than none, and adults who participate in any amount of physical activity, gain some health benefits.  Behavioral modification strategies:increasing lean protein intake, decreasing  simple carbohydrates, increasing vegetables, increase H2O intake, no skipping meals, meal planning and cooking strategies, keeping healthy foods in the home, better snacking choices, avoiding temptations, and planning for success  He was informed of the importance of frequent follow-up visits to maximize his success with intensive lifestyle modifications for his multiple health conditions. He was informed we would discuss his lab results at his next visit unless there is a critical issue that needs to be addressed sooner. Douglas Branch agreed to keep his next visit at the agreed upon time to discuss these results.  Objective:  General: Cooperative, alert, well developed, in no acute distress. HEENT: Conjunctivae and lids unremarkable. Cardiovascular: Regular rhythm.  Lungs: Normal work of breathing. Neurologic: No focal deficits.   Lab Results  Component Value Date   CREATININE 0.59 (L) 11/22/2022   BUN 18 11/22/2022   NA 134 (L) 11/22/2022   K 4.1  11/22/2022   CL 101 11/22/2022   CO2 23 11/22/2022   Lab Results  Component Value Date   ALT 24 11/22/2022   AST 29 11/22/2022   ALKPHOS 68 11/22/2022   BILITOT 0.7 11/22/2022   Lab Results  Component Value Date   HGBA1C 5.6 12/02/2018   No results found for: INSULIN  Lab Results  Component Value Date   TSH 1.520 12/03/2018   Lab Results  Component Value Date   CHOL 224 (H) 12/03/2018   HDL 71 12/03/2018   LDLCALC 134 (H) 12/03/2018   TRIG 107 12/03/2018   CHOLHDL 3.2 12/03/2018   Lab Results  Component Value Date   WBC 5.0 11/22/2022   HGB 14.1 11/22/2022   HCT 42.0 11/22/2022   MCV 91.7 11/22/2022   PLT 100 (L) 11/22/2022   No results found for: IRON, TIBC, FERRITIN  Attestation Statements:  Applicable history such as the following:  allergies, medications, problem list, medical history, surgical history, family history, social history, and previous encounter notes reviewed by clinician on day of visit:  Time spent on visit in care of the patient today including the items listed below was 58 minutes.    20 minutes were spent talking about the history, 30 minutes for face to face counseling implementing the plan, discussing the specifics of how to arrange meals, meal planning, water  intake.   I spent face to face time discussing his/her plan, including breakfast, additional breakfast options, lunch, and dinner options, grocery list, and snacks.  I reviewed her indirect calorimetry. I discussed the implications for the diet plan.    Discussed the bio-impedence test (fat %, muscle mass, and water  weight) and allowed the patient to ask questions.   Discussed the following information sheets: Category 4, Grocery List, 100 Calorie Snacks, 200 Calorie Snacks, Microwave Meals, and Protein Shake sheets. .   I additionally spent time documenting, reviewing, and checking the codes before submitting.   This may have been prepared with the assistance of Licensed conveyancer.  Occasional wrong-word or sound-a-like substitutions may have occurred due to the inherent limitations of voice recognition software.    Clayborne Daring, DO

## 2023-11-06 LAB — TSH+T4F+T3FREE
Free T4: 1.31 ng/dL (ref 0.82–1.77)
T3, Free: 3.4 pg/mL (ref 2.0–4.4)
TSH: 1.29 u[IU]/mL (ref 0.450–4.500)

## 2023-11-06 LAB — CBC WITH DIFFERENTIAL/PLATELET
Basophils Absolute: 0 x10E3/uL (ref 0.0–0.2)
Basos: 0 %
EOS (ABSOLUTE): 0.3 x10E3/uL (ref 0.0–0.4)
Eos: 3 %
Hematocrit: 44 % (ref 37.5–51.0)
Hemoglobin: 14.7 g/dL (ref 13.0–17.7)
Immature Grans (Abs): 0 x10E3/uL (ref 0.0–0.1)
Immature Granulocytes: 0 %
Lymphocytes Absolute: 1.6 x10E3/uL (ref 0.7–3.1)
Lymphs: 22 %
MCH: 31.1 pg (ref 26.6–33.0)
MCHC: 33.4 g/dL (ref 31.5–35.7)
MCV: 93 fL (ref 79–97)
Monocytes Absolute: 0.7 x10E3/uL (ref 0.1–0.9)
Monocytes: 9 %
Neutrophils Absolute: 4.8 x10E3/uL (ref 1.4–7.0)
Neutrophils: 66 %
Platelets: 119 x10E3/uL — ABNORMAL LOW (ref 150–450)
RBC: 4.72 x10E6/uL (ref 4.14–5.80)
RDW: 13.4 % (ref 11.6–15.4)
WBC: 7.4 x10E3/uL (ref 3.4–10.8)

## 2023-11-06 LAB — LIPID PANEL WITH LDL/HDL RATIO
Cholesterol, Total: 237 mg/dL — ABNORMAL HIGH (ref 100–199)
HDL: 58 mg/dL (ref >39)
LDL Chol Calc (NIH): 155 mg/dL — ABNORMAL HIGH (ref 0–99)
LDL/HDL Ratio: 2.7 ratio (ref 0.0–3.6)
Triglycerides: 133 mg/dL (ref 0–149)
VLDL Cholesterol Cal: 24 mg/dL (ref 5–40)

## 2023-11-06 LAB — COMPREHENSIVE METABOLIC PANEL WITH GFR
ALT: 34 IU/L (ref 0–44)
AST: 36 IU/L (ref 0–40)
Albumin: 4.8 g/dL (ref 4.1–5.1)
Alkaline Phosphatase: 89 IU/L (ref 44–121)
BUN/Creatinine Ratio: 18 (ref 9–20)
BUN: 13 mg/dL (ref 6–24)
Bilirubin Total: 0.6 mg/dL (ref 0.0–1.2)
CO2: 20 mmol/L (ref 20–29)
Calcium: 10.1 mg/dL (ref 8.7–10.2)
Chloride: 101 mmol/L (ref 96–106)
Creatinine, Ser: 0.73 mg/dL — ABNORMAL LOW (ref 0.76–1.27)
Globulin, Total: 2.7 g/dL (ref 1.5–4.5)
Glucose: 86 mg/dL (ref 70–99)
Potassium: 4.2 mmol/L (ref 3.5–5.2)
Sodium: 137 mmol/L (ref 134–144)
Total Protein: 7.5 g/dL (ref 6.0–8.5)
eGFR: 112 mL/min/1.73 (ref >59)

## 2023-11-06 LAB — VITAMIN D 25 HYDROXY (VIT D DEFICIENCY, FRACTURES): Vit D, 25-Hydroxy: 33.9 ng/mL (ref 30.0–100.0)

## 2023-11-06 LAB — HEMOGLOBIN A1C
Est. average glucose Bld gHb Est-mCnc: 108 mg/dL
Hgb A1c MFr Bld: 5.4 % (ref 4.8–5.6)

## 2023-11-06 LAB — INSULIN, RANDOM: INSULIN: 19.5 u[IU]/mL (ref 2.6–24.9)

## 2023-11-07 ENCOUNTER — Encounter: Payer: Self-pay | Admitting: Bariatrics

## 2023-11-07 DIAGNOSIS — E7800 Pure hypercholesterolemia, unspecified: Secondary | ICD-10-CM | POA: Insufficient documentation

## 2023-11-19 DIAGNOSIS — M25569 Pain in unspecified knee: Secondary | ICD-10-CM | POA: Diagnosis not present

## 2023-11-19 DIAGNOSIS — G894 Chronic pain syndrome: Secondary | ICD-10-CM | POA: Diagnosis not present

## 2023-11-19 DIAGNOSIS — M5451 Vertebrogenic low back pain: Secondary | ICD-10-CM | POA: Diagnosis not present

## 2023-11-19 DIAGNOSIS — Z79891 Long term (current) use of opiate analgesic: Secondary | ICD-10-CM | POA: Diagnosis not present

## 2023-11-22 DIAGNOSIS — Z419 Encounter for procedure for purposes other than remedying health state, unspecified: Secondary | ICD-10-CM | POA: Diagnosis not present

## 2023-11-22 DIAGNOSIS — H5213 Myopia, bilateral: Secondary | ICD-10-CM | POA: Diagnosis not present

## 2023-11-27 ENCOUNTER — Encounter: Payer: Self-pay | Admitting: Bariatrics

## 2023-11-27 ENCOUNTER — Ambulatory Visit: Admitting: Bariatrics

## 2023-11-27 VITALS — BP 168/90 | HR 60 | Temp 97.6°F | Ht 70.0 in | Wt 338.0 lb

## 2023-11-27 DIAGNOSIS — E559 Vitamin D deficiency, unspecified: Secondary | ICD-10-CM | POA: Diagnosis not present

## 2023-11-27 DIAGNOSIS — R0683 Snoring: Secondary | ICD-10-CM | POA: Diagnosis not present

## 2023-11-27 DIAGNOSIS — R632 Polyphagia: Secondary | ICD-10-CM

## 2023-11-27 DIAGNOSIS — E785 Hyperlipidemia, unspecified: Secondary | ICD-10-CM | POA: Diagnosis not present

## 2023-11-27 DIAGNOSIS — G4733 Obstructive sleep apnea (adult) (pediatric): Secondary | ICD-10-CM

## 2023-11-27 DIAGNOSIS — Z6841 Body Mass Index (BMI) 40.0 and over, adult: Secondary | ICD-10-CM | POA: Diagnosis not present

## 2023-11-27 DIAGNOSIS — K5900 Constipation, unspecified: Secondary | ICD-10-CM

## 2023-11-27 DIAGNOSIS — K5909 Other constipation: Secondary | ICD-10-CM

## 2023-11-27 DIAGNOSIS — R5383 Other fatigue: Secondary | ICD-10-CM

## 2023-11-27 DIAGNOSIS — Z72820 Sleep deprivation: Secondary | ICD-10-CM | POA: Diagnosis not present

## 2023-11-27 DIAGNOSIS — Z9189 Other specified personal risk factors, not elsewhere classified: Secondary | ICD-10-CM

## 2023-11-27 MED ORDER — WEGOVY 0.25 MG/0.5ML ~~LOC~~ SOAJ: 0.2500 mg | SUBCUTANEOUS | 0 refills | Status: DC

## 2023-11-27 MED ORDER — POLYETHYLENE GLYCOL 3350 17 GM/SCOOP PO POWD
17.0000 g | Freq: Every day | ORAL | 0 refills | Status: DC
Start: 2023-11-27 — End: 2023-12-24

## 2023-11-27 MED ORDER — VITAMIN D (ERGOCALCIFEROL) 1.25 MG (50000 UNIT) PO CAPS: 50000.0000 [IU] | ORAL_CAPSULE | ORAL | 0 refills | Status: DC

## 2023-11-27 NOTE — Progress Notes (Unsigned)
 First follow-up after initial visit.        WEIGHT SUMMARY AND BIOMETRICS  Weight Lost Since Last Visit: 0  Weight Gained Since Last Visit: 9lb   Vitals Temp: 97.6 F (36.4 C) BP: (!) 166/77 Pulse Rate: 60 SpO2: 98 %   Anthropometric Measurements Height: 5' 10 (1.778 m) Weight: (!) 338 lb (153.3 kg) BMI (Calculated): 48.5 Weight at Last Visit: 329lb Weight Lost Since Last Visit: 0 Weight Gained Since Last Visit: 9lb Starting Weight: 329lb Total Weight Loss (lbs): 0 lb (0 kg) Waist Measurement : 55 inches   No data recorded Other Clinical Data Fasting: no Labs: no Today's Visit #: 2 Starting Date: 11/05/23    OBESITY Douglas Branch is here to discuss his progress with his obesity treatment plan along with follow-up of his obesity related diagnoses.    Nutrition Plan: the Category 4 plan and keeping a food journal with goal of 1800-2000 calories and 100-140 grams of protein daily - 90% adherence.  Current exercise: push ups and curls   Interim History:  He is up 9 lbs since his last visit.  Eating all of the food on the plan., Protein intake is as prescribed, Is skipping meals, Not journaling consistently., and Water  intake is inadequate.  Initial positives regarding the dietary plan: easy to follow Initial challenges regarding  the dietary plan: no struggles noted. He has had some cravings in the evening after dinner. He states that he has his cravings under control.   Pharmacotherapy: Douglas Branch will start on  Wegovy  0.25 mg SQ weekly Hunger is moderately controlled.  Cravings are moderately controlled.  Assessment/Plan:   Constipation Douglas Branch notes constipation.   This is likely related to his pain medication. Constipation is poorly controlled.   Plan: Polypropylene otherwise known as MiraLAX  17 g into 4 to 8 ounces of water  daily  Increase  fiber up to 25 to 30 grams of fiber.   Increase water  intake to at least 64 ounces daily.  Add MiraLAX  once daily.  May take twice a day or every other day based on results. Rx:  Add Metamucil or Citrucel daily. Add magnesium citrate daily.   He will add and Fleet suppositories if he has stool retention not able to completely evacuate his bowels  Hyperlipidemia LDL is not at goal. Medication(s): none Cardiovascular risk factors: male gender, obesity (BMI >= 30 kg/m2), and sedentary lifestyle  Lab Results  Component Value Date   CHOL 237 (H) 11/05/2023   HDL 58 11/05/2023   LDLCALC 155 (H) 11/05/2023   TRIG 133 11/05/2023   CHOLHDL 3.2 12/03/2018   Lab Results  Component Value Date   ALT 34 11/05/2023   AST 36 11/05/2023   ALKPHOS 89 11/05/2023   BILITOT 0.6 11/05/2023   The 10-year ASCVD risk score (Arnett DK, et al., 2019) is: 4.9%   Values used to calculate the score:     Age: 48 years     Clincally relevant sex: Male     Is Non-Hispanic African American: No     Diabetic: No     Tobacco smoker: No     Systolic Blood Pressure: 168 mmHg     Is BP treated: No     HDL Cholesterol: 58 mg/dL     Total Cholesterol: 237 mg/dL  Plan:  Information sheet on healthy vs unhealthy fats.  Will avoid all trans fats.  Will read labels Will minimize saturated fats except the following: low fat meats in moderation, diary, and limited dark  chocolate.  Increase Omega 3 in foods, and consider an Omega 3 supplement.   Vitamin D  Deficiency Vitamin D  is not at goal of 50.  Most recent vitamin D  level was 33.9. He is on  prescription ergocalciferol  50,000 IU weekly. Lab Results  Component Value Date   VD25OH 33.9 11/05/2023   VD25OH 31.62 11/22/2022   VD25OH 19.8 (L) 12/03/2018    Plan: Begin prescription vitamin D  50,000 IU weekly.   Obstructive Sleep Apnea/Lack of adequate sleep/Snoring:  Douglas Branch has a diagnosis of sleep apnea. He reports that he is using a CPAP regularly.  Reports unrestful sleep.  He states that he has not had a repeat sleep study in a number of years and his sleep is not as restful as it has been in the past and he would like to have a new sleep study.   Plan: Continue CPAP therapy. Continue to practice good sleep hygiene.  Will add in elements of an antiinflammatory diet and  add in elements of a Mediterranean diet.  Reduce intake of carbohydrates for weight loss.  Will schedule him for a new sleep study secondary to snoring, waking up at night tired and changes in weight.     History of weight change:   He has had some weight fluctuations over time with his weight being over 400 pounds at 1 time and then being lower.  He is having some problems with his CPAP machine and is not getting restful sleep.  Plan: Will schedule for a sleep study.  Snoring:  He snores and is not getting restful sleep he feels tired during the day.  He is using his CPAP consistently. He has not had a sleep study in some time.  Plan: Will schedule for a sleep study. Use his CPAP during the day if he naps.  Polyphagia Douglas Branch endorses excessive hunger.  Medication(s): none Effects of medication:  moderately controlled. Cravings are moderately controlled.   Plan: Medication(s): Will start Wegovy  0.25 mg SQ weekly Will increase water , protein and fiber to help assuage hunger.  Will minimize foods that have a high glucose index/load to minimize reactive hypoglycemia.  He will start to journal his food including his calories and his protein on a regular basis. We discussed apps for tracking his food and calories.  His older son was with him and was able to bring up my fitness pal and we discussed how to use the app.     Morbid Obesity: Current BMI BMI (Calculated): 48.5   Pharmacotherapy Plan Start  Wegovy  0.25 mg SQ weekly  Douglas Branch denies personal or family history of thyroid cancer, history of pancreatitis, or current cholelithiasis. Douglas Branch was  informed of the most common side effects (nausea, constipation, diarrhea). He was given GLP-1 information sheet.  Patient informed to watch for possible symptoms, such as a lump or swelling in the neck, hoarseness, trouble swallowing, or shortness of breath. If you have any of these symptoms, tell your healthcare provide.   He has been placed on a 500 calorie deficit diet.  He has been advised to exercise at least 150 minutes per week, both cardio and resistance.    Douglas Branch is currently in the action stage of change. As such, his goal is to continue with weight loss efforts.  He has agreed to the Category 4 plan and keeping a food journal with goal of 1,800 to 2,000 calories and 100 to 140 grams of protein daily.  Exercise goals: All adults should avoid inactivity. Some physical  activity is better than none, and adults who participate in any amount of physical activity gain some health benefits. He has limitations with exercise secondary to chronic pain.   Behavioral modification strategies: increasing lean protein intake, no meal skipping, meal planning , increase water  intake, better snacking choices, planning for success, increasing lower sugar fruits, decrease snacking , avoiding temptations, and keep healthy foods in the home.  [place] has agreed to follow-up with our clinic in 2 weeks.   Labs reviewed today from last visit (CMP, Lipids, HgbA1c, insulin , vitamin D , and thyroid panel).   Objective:   VITALS: Per patient if applicable, see vitals. GENERAL: Alert and in no acute distress. CARDIOPULMONARY: No increased WOB. Speaking in clear sentences.  PSYCH: Pleasant and cooperative. Speech normal rate and rhythm. Affect is appropriate. Insight and judgement are appropriate. Attention is focused, linear, and appropriate.  NEURO: Oriented as arrived to appointment on time with no prompting.   Attestation Statements:   This was prepared with the assistance of Engineer, civil (consulting).   Occasional wrong-word or sound-a-like substitutions may have occurred due to the inherent limitations of voice recognition software.    Clayborne Daring, DO

## 2023-11-28 ENCOUNTER — Encounter: Payer: Self-pay | Admitting: Gastroenterology

## 2023-11-28 ENCOUNTER — Telehealth: Payer: Self-pay

## 2023-11-28 NOTE — Telephone Encounter (Signed)
 Started PA for Wegovy  0.25 via covermymeds

## 2023-11-28 NOTE — Telephone Encounter (Addendum)
 Message from plan:  Your prior authorization for Wegovy  has been approved!  Message from plan: Approved. This drug has been approved. Approved quantity: 2 milliliters per 28 day(s). You may fill up to a 34 day supply at a retail pharmacy. You may fill up to a 90 day supply for maintenance drugs, please refer to the formulary for details. Please call the pharmacy to process your prescription claim.. Authorization Expiration Date: May 26, 2024.

## 2023-12-11 ENCOUNTER — Ambulatory Visit: Admitting: Bariatrics

## 2023-12-17 ENCOUNTER — Ambulatory Visit: Admitting: Bariatrics

## 2023-12-23 ENCOUNTER — Other Ambulatory Visit: Payer: Self-pay | Admitting: Bariatrics

## 2023-12-24 DIAGNOSIS — G894 Chronic pain syndrome: Secondary | ICD-10-CM | POA: Diagnosis not present

## 2023-12-24 DIAGNOSIS — M5451 Vertebrogenic low back pain: Secondary | ICD-10-CM | POA: Diagnosis not present

## 2023-12-24 DIAGNOSIS — M25569 Pain in unspecified knee: Secondary | ICD-10-CM | POA: Diagnosis not present

## 2023-12-24 DIAGNOSIS — M25559 Pain in unspecified hip: Secondary | ICD-10-CM | POA: Diagnosis not present

## 2023-12-24 DIAGNOSIS — Z79891 Long term (current) use of opiate analgesic: Secondary | ICD-10-CM | POA: Diagnosis not present

## 2023-12-31 ENCOUNTER — Ambulatory Visit: Admitting: Bariatrics

## 2024-01-03 DIAGNOSIS — Z862 Personal history of diseases of the blood and blood-forming organs and certain disorders involving the immune mechanism: Secondary | ICD-10-CM | POA: Diagnosis not present

## 2024-01-03 DIAGNOSIS — Z1329 Encounter for screening for other suspected endocrine disorder: Secondary | ICD-10-CM | POA: Diagnosis not present

## 2024-01-03 DIAGNOSIS — Z0001 Encounter for general adult medical examination with abnormal findings: Secondary | ICD-10-CM | POA: Diagnosis not present

## 2024-01-03 DIAGNOSIS — Z131 Encounter for screening for diabetes mellitus: Secondary | ICD-10-CM | POA: Diagnosis not present

## 2024-01-03 DIAGNOSIS — Z13220 Encounter for screening for lipoid disorders: Secondary | ICD-10-CM | POA: Diagnosis not present

## 2024-01-03 DIAGNOSIS — Z1321 Encounter for screening for nutritional disorder: Secondary | ICD-10-CM | POA: Diagnosis not present

## 2024-01-03 DIAGNOSIS — K7460 Unspecified cirrhosis of liver: Secondary | ICD-10-CM | POA: Diagnosis not present

## 2024-01-03 DIAGNOSIS — G4733 Obstructive sleep apnea (adult) (pediatric): Secondary | ICD-10-CM | POA: Diagnosis not present

## 2024-01-06 ENCOUNTER — Ambulatory Visit: Admitting: Bariatrics

## 2024-01-13 ENCOUNTER — Ambulatory Visit: Admitting: Bariatrics

## 2024-01-13 ENCOUNTER — Encounter: Payer: Self-pay | Admitting: Bariatrics

## 2024-01-13 VITALS — BP 140/84 | HR 78 | Ht 70.0 in | Wt 335.0 lb

## 2024-01-13 DIAGNOSIS — E66813 Obesity, class 3: Secondary | ICD-10-CM

## 2024-01-13 DIAGNOSIS — E559 Vitamin D deficiency, unspecified: Secondary | ICD-10-CM

## 2024-01-13 DIAGNOSIS — Z6841 Body Mass Index (BMI) 40.0 and over, adult: Secondary | ICD-10-CM

## 2024-01-13 DIAGNOSIS — R632 Polyphagia: Secondary | ICD-10-CM

## 2024-01-13 NOTE — Progress Notes (Signed)
 WEIGHT SUMMARY AND BIOMETRICS  Weight Lost Since Last Visit: 3lb  Weight Gained Since Last Visit: 0   Vitals Temp: -- (could not obtain) BP: (!) 140/84 Pulse Rate: 78 SpO2: 98 %   Anthropometric Measurements Height: 5' 10 (1.778 m) Weight: (!) 335 lb (152 kg) BMI (Calculated): 48.07 Weight at Last Visit: 338lb Weight Lost Since Last Visit: 3lb Weight Gained Since Last Visit: 0 Starting Weight: 329lb Total Weight Loss (lbs): 0 lb (0 kg) Peak Weight: 485lb   No data recorded Other Clinical Data Fasting: no Labs: no Today's Visit #: 3 Starting Date: 11/05/23    OBESITY Douglas Branch is here to discuss his progress with his obesity treatment plan along with follow-up of his obesity related diagnoses.    Nutrition Plan: the Category 4 plan and keeping a food journal with goal of 1800-2000 calories and 100-140 grams of protein daily - 80-85% adherence.  Current exercise: weight lifting  Interim History:  He is down 3 lbs since his last visit.  Eating all of the food on the plan., Protein intake is as prescribed, Is skipping meals, and Water  intake is adequate.   Pharmacotherapy: Yarel is on Wegovy  0.25 mg SQ weekly Adverse side effects:  Hunger is moderately controlled.  Cravings are moderately controlled.  Assessment/Plan:   [name] endorses excessive hunger.  Medication(s): He was started on Wegovy , but has not started it yet.  Effects of medication (appetite) :  moderately controlled. Cravings are moderately controlled.   Plan: Medication(s): Wegovy  0.25 mg SQ weekly Will increase water , protein and fiber to help assuage hunger.  Will minimize foods that have a high glucose index/load to minimize reactive hypoglycemia.   Vitamin D  Deficiency Vitamin D  is not at goal of 50.  Most recent vitamin D  level was 33.9. He is on  prescription  ergocalciferol  50,000 IU weekly. Lab Results  Component Value Date   VD25OH 33.9 11/05/2023   VD25OH 31.62 11/22/2022   VD25OH 19.8 (L) 12/03/2018    Plan: Continue prescription vitamin D  50,000 IU weekly.    Morbid Obesity: Current BMI BMI (Calculated): 48.07   Pharmacotherapy Plan Continue  Wegovy  0.25 mg SQ weekly (will start this week).   [name] is currently in the action stage of change. As such, his goal is to continue with weight loss efforts.  He has agreed to the Category 4 plan, and journaling 1,800 to 2,000 calories and 100 to 140 grams of protein.   Exercise goals: All adults should avoid inactivity. Some physical activity is better than none, and adults who participate in any amount of physical activity gain some health benefits. We discussed him starting at a gym and suggested water  activities, recumbent bike, or arm machine.   Behavioral modification strategies: increasing lean protein intake, decreasing simple carbohydrates , no meal skipping, meal planning , increase water  intake, better snacking choices, planning  for success, increasing vegetables, avoiding temptations, keep healthy foods in the home, weigh protein portions, work on smaller portions, and mindful eating.  [place] has agreed to follow-up with our clinic in 2 weeks.     Objective:   VITALS: Per patient if applicable, see vitals. GENERAL: Alert and in no acute distress. CARDIOPULMONARY: No increased WOB. Speaking in clear sentences.  PSYCH: Pleasant and cooperative. Speech normal rate and rhythm. Affect is appropriate. Insight and judgement are appropriate. Attention is focused, linear, and appropriate.  NEURO: Oriented as arrived to appointment on time with no prompting.   Attestation Statements:   This was prepared with the assistance of Engineer, Civil (consulting).  Occasional wrong-word or sound-a-like substitutions may have occurred due to the inherent limitations of voice recognition   Clayborne Daring, DO

## 2024-01-16 ENCOUNTER — Institutional Professional Consult (permissible substitution): Admitting: Neurology

## 2024-01-21 ENCOUNTER — Other Ambulatory Visit: Payer: Self-pay | Admitting: Bariatrics

## 2024-01-22 DIAGNOSIS — Z419 Encounter for procedure for purposes other than remedying health state, unspecified: Secondary | ICD-10-CM | POA: Diagnosis not present

## 2024-01-27 DIAGNOSIS — Z6841 Body Mass Index (BMI) 40.0 and over, adult: Secondary | ICD-10-CM | POA: Diagnosis not present

## 2024-01-27 DIAGNOSIS — R351 Nocturia: Secondary | ICD-10-CM | POA: Diagnosis not present

## 2024-01-27 DIAGNOSIS — G4733 Obstructive sleep apnea (adult) (pediatric): Secondary | ICD-10-CM | POA: Diagnosis not present

## 2024-01-27 DIAGNOSIS — N401 Enlarged prostate with lower urinary tract symptoms: Secondary | ICD-10-CM | POA: Diagnosis not present

## 2024-01-27 DIAGNOSIS — K7460 Unspecified cirrhosis of liver: Secondary | ICD-10-CM | POA: Diagnosis not present

## 2024-01-27 DIAGNOSIS — D696 Thrombocytopenia, unspecified: Secondary | ICD-10-CM | POA: Diagnosis not present

## 2024-01-27 DIAGNOSIS — Z0000 Encounter for general adult medical examination without abnormal findings: Secondary | ICD-10-CM | POA: Diagnosis not present

## 2024-01-27 DIAGNOSIS — G8929 Other chronic pain: Secondary | ICD-10-CM | POA: Diagnosis not present

## 2024-01-28 ENCOUNTER — Ambulatory Visit: Admitting: Bariatrics

## 2024-01-28 ENCOUNTER — Encounter: Payer: Self-pay | Admitting: Bariatrics

## 2024-01-28 ENCOUNTER — Telehealth: Payer: Self-pay | Admitting: Neurology

## 2024-01-28 VITALS — BP 137/78 | HR 74 | Ht 70.0 in | Wt 339.0 lb

## 2024-01-28 DIAGNOSIS — K5900 Constipation, unspecified: Secondary | ICD-10-CM | POA: Diagnosis not present

## 2024-01-28 DIAGNOSIS — E559 Vitamin D deficiency, unspecified: Secondary | ICD-10-CM

## 2024-01-28 DIAGNOSIS — R632 Polyphagia: Secondary | ICD-10-CM

## 2024-01-28 DIAGNOSIS — K7581 Nonalcoholic steatohepatitis (NASH): Secondary | ICD-10-CM

## 2024-01-28 DIAGNOSIS — Z6841 Body Mass Index (BMI) 40.0 and over, adult: Secondary | ICD-10-CM | POA: Diagnosis not present

## 2024-01-28 DIAGNOSIS — K7469 Other cirrhosis of liver: Secondary | ICD-10-CM | POA: Diagnosis not present

## 2024-01-28 DIAGNOSIS — K5909 Other constipation: Secondary | ICD-10-CM

## 2024-01-28 MED ORDER — WEGOVY 0.25 MG/0.5ML ~~LOC~~ SOAJ: 0.2500 mg | SUBCUTANEOUS | 0 refills | Status: DC

## 2024-01-28 MED ORDER — VITAMIN D (ERGOCALCIFEROL) 1.25 MG (50000 UNIT) PO CAPS: 50000.0000 [IU] | ORAL_CAPSULE | ORAL | 0 refills | Status: AC

## 2024-01-28 NOTE — Telephone Encounter (Signed)
 Pt left a vm asking to be called back to r/s his sleep consult, phone rep called pt, he has been r/s.

## 2024-01-28 NOTE — Progress Notes (Addendum)
 WEIGHT SUMMARY AND BIOMETRICS  Weight Lost Since Last Visit: 0  Weight Gained Since Last Visit: 4lb   Vitals BP: (!) 170/94 Pulse Rate: 74 SpO2: 97 %   Anthropometric Measurements Height: 5' 10 (1.778 m) Weight: (!) 339 lb (153.8 kg) BMI (Calculated): 48.64 Weight at Last Visit: 335lb Weight Lost Since Last Visit: 0 Weight Gained Since Last Visit: 4lb Starting Weight: 329lb Total Weight Loss (lbs): 0 lb (0 kg) Peak Weight: 485lb   No data recorded Other Clinical Data Fasting: no Labs: no Today's Visit #: 4 Starting Date: 11/05/23    OBESITY Douglas Branch is here to discuss his progress with his obesity treatment plan along with follow-up of his obesity related diagnoses.    Nutrition Plan: the Category 4 plan and keeping a food journal with goal of 1800-2000 calories and 100-140 grams of protein daily - 85-90% adherence.  Current exercise: swimming and [employer]  Interim History:  He is up 4 lbs since his last visit.  Eating all of the food on the plan., Protein intake is as prescribed, Is not skipping meals, and Water  intake is adequate.   Pharmacotherapy: Douglas Branch is on Wegovy  0.25 mg SQ weekly Adverse side effects: Constipation Hunger is moderately controlled.  Cravings are poorly controlled. Has some sugar cravings.  Assessment/Plan:   Douglas Branch endorses excessive hunger.  Medication(s): Wegovy  Effects of medication (appetite):  moderately controlled. Cravings are moderately controlled.   Plan: Medication(s): Wegovy  0.25 mg SQ weekly Will increase water , protein and fiber to help assuage hunger.  Will minimize foods that have a high glucose index/load to minimize reactive hypoglycemia.   Constipation Douglas Branch notes constipation.   This is likely related to GLP-1, decreased activity, and pain medications.  He has been taking  Miralax . Constipation is poorly controlled.   Plan: Increase fiber up to 25 to 30 grams of fiber.  Will use psyllium husks with a full glass of water  daily.  Increase water  intake to at least 64 ounces daily.  Add MiraLAX  once daily.  May take twice a day or every other day based on results. Will add Colace daily.  Will use MOM if recalcitrant.   Will use magnesium citrate if severe.   Other cirrhosis of the liver:   He has seen [facility] in the past, primary GI physician Dr. Franchot.  His last visit they are was in April 2025 his diagnosis was alcoholic cirrhosis plus or minus MASH that was diagnosed in 2021 after workup for thrombocytopenia, and and significant alcohol abuse. He had a previous serological workup that was negative for viral hepatitis A1AT deficiency, Wilson disease hereditary hemochromatosis and autoimmune hepatitis.   He had an ultrasound on 11/02/2022 which showed nodule heterogeneous at echogenic liver with no focal liver lesion.  He stopped drinking alcohol in 2022.  He states that he used to weigh over 400 pounds,  currently weighs 339 pounds. He had a EGD that was done on 11/26/2022.   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   Fibrosis 4 Score = 2.54 (Indeterminate)       Interpretation for patients with NAFLD          <1.30       -  F0-F1 (Low risk)          1.30-2.67 -  Indeterminate           >2.67      -  F3-F4 (High risk)     Validated for ages 98-65  Plan: FibroScan and staging needed (will [facility] to [facility]).  Needs to continue Wegovy .         Morbid Obesity: Current BMI BMI (Calculated): 48.64   Pharmacotherapy Plan Continue and refill  Wegovy  0.25 mg SQ weekly  Douglas Branch is currently in the action stage of change. As such, his goal is to continue with weight loss efforts.  He has agreed to the Category 4 plan and keeping a food journal with goal of 1,800 to  2,000 calories and 100 to 140 grams of protein daily.  Exercise goals: All adults should avoid inactivity. Some physical activity is better than none, and adults who participate in any amount of physical activity gain some health benefits. [facility].   Behavioral modification strategies: increasing lean protein intake, no meal skipping, meal planning , better snacking choices, planning for success, decrease snacking , avoiding temptations, and keep healthy foods in the home.  Douglas Branch has agreed to follow-up with our clinic in 4 weeks.      Objective:   VITALS: Per patient if applicable, see vitals. GENERAL: Alert and in no acute distress. CARDIOPULMONARY: No increased WOB. Speaking in clear sentences.  PSYCH: Pleasant and cooperative. Speech normal rate and rhythm. Affect is appropriate. Insight and judgement are appropriate. Attention is focused, linear, and appropriate.  NEURO: Oriented as arrived to appointment on time with no prompting.   Attestation Statements:   This was prepared with the assistance of Engineer, Civil (consulting).  Occasional wrong-word or sound-a-like substitutions may have occurred due to the inherent limitations of voice recognition.  Douglas Branch, Douglas Branch

## 2024-01-28 NOTE — Addendum Note (Signed)
 Addended by: ONEITA JOSETTE CROME on: 01/28/2024 04:15 PM   Modules accepted: Orders

## 2024-01-29 ENCOUNTER — Telehealth: Payer: Self-pay

## 2024-01-29 NOTE — Telephone Encounter (Signed)
 Started PA for Douglas Branch 0.25mg  via covermymeds.

## 2024-01-29 NOTE — Telephone Encounter (Signed)
 Wegovy  approved 01/29/24-07/27/2024

## 2024-02-20 ENCOUNTER — Institutional Professional Consult (permissible substitution): Admitting: Neurology

## 2024-02-20 ENCOUNTER — Telehealth: Payer: Self-pay | Admitting: Neurology

## 2024-02-20 NOTE — Telephone Encounter (Signed)
 LVM and sent MyChart msg informing pt of 12/11 appt cancellation- MD called out.

## 2024-02-23 ENCOUNTER — Other Ambulatory Visit: Payer: Self-pay | Admitting: Bariatrics

## 2024-02-25 ENCOUNTER — Ambulatory Visit: Admitting: Gastroenterology

## 2024-02-25 DIAGNOSIS — G894 Chronic pain syndrome: Secondary | ICD-10-CM | POA: Diagnosis not present

## 2024-02-25 DIAGNOSIS — M5451 Vertebrogenic low back pain: Secondary | ICD-10-CM | POA: Diagnosis not present

## 2024-02-26 ENCOUNTER — Ambulatory Visit: Admitting: Bariatrics

## 2024-03-11 ENCOUNTER — Ambulatory Visit: Admitting: Bariatrics

## 2024-03-27 ENCOUNTER — Encounter: Payer: Self-pay | Admitting: Urology

## 2024-03-27 ENCOUNTER — Ambulatory Visit: Admitting: Urology

## 2024-03-27 VITALS — BP 135/75 | HR 71

## 2024-03-27 DIAGNOSIS — N138 Other obstructive and reflux uropathy: Secondary | ICD-10-CM

## 2024-03-27 DIAGNOSIS — R351 Nocturia: Secondary | ICD-10-CM | POA: Diagnosis not present

## 2024-03-27 DIAGNOSIS — N401 Enlarged prostate with lower urinary tract symptoms: Secondary | ICD-10-CM | POA: Diagnosis not present

## 2024-03-27 LAB — URINALYSIS, ROUTINE W REFLEX MICROSCOPIC
Bilirubin, UA: NEGATIVE
Glucose, UA: NEGATIVE
Ketones, UA: NEGATIVE
Leukocytes,UA: NEGATIVE
Nitrite, UA: NEGATIVE
Protein,UA: NEGATIVE
RBC, UA: NEGATIVE
Specific Gravity, UA: <1.005 — ABNORMAL LOW (ref 1.005–1.030)
Urobilinogen, Ur: 0.2 mg/dL (ref 0.2–1.0)
pH, UA: 6 (ref 5.0–7.5)

## 2024-03-27 LAB — BLADDER SCAN AMB NON-IMAGING: Scan Result: 0

## 2024-03-27 NOTE — Patient Instructions (Signed)

## 2024-03-27 NOTE — Progress Notes (Signed)
 "  03/27/2024 10:04 AM   Douglas Branch 13-Aug-1975 [id number]  Referring provider: Jolee Elsie RAMAN, PA [address] [place],  [place] [address]  nocturia   HPI: Mr Douglas Branch is a 48yo here for evaluation of nocturia. For the past 4 years he has had nocturia 3-4x large volume. He does not drink any fluids within 2 hours of going to bed. IPSS 14 QOL 6. He has OSA and has a CPAP which he is going to get adjusted in the next couple of weeks. It has not been adjusted in 3 years. Urine stream strong. No straining to urinate. He has urinary frequency every 1-2 hours during the day. He has issues with constipation and takes fiber.    PMH: Past Medical History:  Diagnosis Date   ADD (attention deficit disorder)    Allergy    seasonal    Arthritis    hips, knee   Back pain    Constipation    DDD (degenerative disc disease), lumbar    Fatty liver    GERD (gastroesophageal reflux disease)    Joint pain    Lactose intolerance    Osteoarthritis    Palpitations    PONV (postoperative nausea and vomiting)    Pre-diabetes    Sleep apnea     Surgical History: Past Surgical History:  Procedure Laterality Date   COLONOSCOPY WITH PROPOFOL  N/A 11/26/2022   Procedure: COLONOSCOPY WITH PROPOFOL ;  Surgeon: Cindie Carlin POUR, DO;  [facility];  Service: Endoscopy;  Laterality: N/A;  9:45 am, asa 3, pt knows to arrive at 6:30   ESOPHAGOGASTRODUODENOSCOPY (EGD) WITH PROPOFOL  N/A 11/26/2022   Procedure: ESOPHAGOGASTRODUODENOSCOPY (EGD) WITH PROPOFOL ;  Surgeon: Cindie Carlin POUR, DO;  [facility];  Service: Endoscopy;  Laterality: N/A;   HEMOSTASIS CLIP PLACEMENT  11/26/2022   Procedure: HEMOSTASIS CLIP PLACEMENT;  Surgeon: Cindie Carlin POUR, DO;  [facility];  Service: Endoscopy;;   POLYPECTOMY  11/26/2022   Procedure: POLYPECTOMY;  Surgeon: Cindie Carlin POUR, DO;  [facility];  Service: Endoscopy;;   ROTATOR CUFF REPAIR Left 2010   TONSILLECTOMY      WISDOM TOOTH EXTRACTION      Home Medications:  Allergies as of 03/27/2024       Reactions   Pollen Extract Itching   Watery eyes        Medication List        Accurate as of March 27, 2024 10:04 AM. If you have any questions, ask your nurse or doctor.          BIOTIN PO Take by mouth.   GaviLAX 17 GM/SCOOP powder Generic drug: polyethylene glycol powder DISSOLVE 1 CAPFUL (17 GRAMS) IN 4-8 OUNCES OF LIQUID AND TAKE BY MOUTH DAILY.   L-ARGININE PO Take by mouth.   MAGNESIUM COMPLEX PO Take by mouth.   methocarbamol 500 MG tablet Commonly known as: ROBAXIN Take by mouth.   MORPHINE  SULFATE (CONCENTRATE) PO Take by mouth.   multivitamin with minerals Tabs tablet Take 1 tablet by mouth daily.   oxyCODONE 5 MG immediate release tablet Commonly known as: Oxy IR/ROXICODONE Take 5 mg by mouth every 4 (four) hours as needed for severe pain.   UNABLE TO FIND Take 10,000 mg by mouth daily. Med Name: Marijuana   VITAMIN D  (CHOLECALCIFEROL) PO Take by mouth.   Vitamin D  (Ergocalciferol ) 1.25 MG (50000 UNIT) Caps capsule Commonly known as: DRISDOL  Take 1 capsule (50,000 Units total) by  mouth every 7 (seven) days.   Wegovy  0.25 MG/0.5ML Soaj SQ injection Generic drug: semaglutide -weight management Inject 0.25 mg into the skin once a week.        Allergies: Allergies[1]  Family History: Family History  Problem Relation Age of Onset   Hyperlipidemia Mother    Hypertension Mother    Diabetes Mother    Heart attack Mother 42       CABG   Anxiety disorder Mother    Sleep apnea Mother    Hypertension Father    Hyperlipidemia Father    Diabetes Father    Depression Father    Anxiety disorder Father    Sleep apnea Father    Eating disorder Father    Obesity Father    Diabetes Maternal Grandmother    Hyperlipidemia Maternal Grandmother    Hypertension Maternal Grandmother    Diabetes Maternal Grandfather    Hyperlipidemia Maternal Grandfather     Hypertension Maternal Grandfather    Diabetes Paternal Grandmother    Hyperlipidemia Paternal Grandmother    Emphysema Paternal Grandmother    Hypertension Paternal Grandmother    Diabetes Paternal Grandfather    Hyperlipidemia Paternal Grandfather    Hypertension Paternal Grandfather    Gout Paternal Uncle     Social History:  reports that he has quit smoking. He has never used smokeless tobacco. He reports current drug use. Drug: Marijuana. He reports that he does not drink alcohol.  ROS: All other review of systems were reviewed and are negative except what is noted above in HPI  Physical Exam: BP 135/75   Pulse 71   Constitutional:  Alert and oriented, No acute distress. HEENT: [place] AT, moist mucus membranes.  Trachea midline, no masses. Cardiovascular: No clubbing, cyanosis, or edema. Respiratory: Normal respiratory effort, no increased work of breathing. GI: Abdomen is soft, nontender, nondistended, no abdominal masses GU: No CVA tenderness.  Lymph: No cervical or inguinal lymphadenopathy. Skin: No rashes, bruises or suspicious lesions. Neurologic: Grossly intact, no focal deficits, moving all 4 extremities. Psychiatric: Normal mood and affect.  Laboratory Data: Lab Results  Component Value Date   WBC 7.4 11/05/2023   HGB 14.7 11/05/2023   HCT 44.0 11/05/2023   MCV 93 11/05/2023   PLT 119 (L) 11/05/2023    Lab Results  Component Value Date   CREATININE 0.73 (L) 11/05/2023    No results found for: PSA  Lab Results  Component Value Date   TESTOSTERONE  732 12/03/2018    Lab Results  Component Value Date   HGBA1C 5.4 11/05/2023    Urinalysis No results found for: COLORURINE, APPEARANCEUR, LABSPEC, PHURINE, GLUCOSEU, HGBUR, BILIRUBINUR, KETONESUR, PROTEINUR, UROBILINOGEN, NITRITE, LEUKOCYTESUR  No results found for: LABMICR, WBCUA, RBCUA, LABEPIT, MUCUS, BACTERIA  Pertinent Imaging:  No results found for this or  any previous visit.  No results found for this or any previous visit.  No results found for this or any previous visit.  No results found for this or any previous visit.  No results found for this or any previous visit.  No results found for this or any previous visit.  No results found for this or any previous visit.  No results found for this or any previous visit.   Assessment & Plan:    1. Nocturia (Primary) -We discussed the natural hx of nocturia and the various causes. His nocturia is likely related to poorly controled OSA. If adjusting his CPAP fails to improve his nocturia then we will proceed with OAb therapy.  -  Urinalysis, Routine w reflex microscopic - BLADDER SCAN AMB NON-IMAGING  2. Benign prostatic hyperplasia with urinary obstruction -PSA, will call with results   No follow-ups on file.  Belvie Clara, MD  [facility]      [1]  Allergies Allergen Reactions   Pollen Extract Itching    Watery eyes   "

## 2024-03-28 LAB — PSA: Prostate Specific Ag, Serum: 0.2 ng/mL (ref 0.0–4.0)

## 2024-03-31 ENCOUNTER — Ambulatory Visit: Payer: Self-pay | Admitting: Urology

## 2024-04-07 ENCOUNTER — Institutional Professional Consult (permissible substitution): Admitting: Neurology

## 2024-04-10 ENCOUNTER — Encounter: Payer: Self-pay | Admitting: Neurology

## 2024-04-10 ENCOUNTER — Ambulatory Visit: Admitting: Neurology

## 2024-04-10 VITALS — BP 143/86 | HR 89 | Ht 70.0 in | Wt 354.0 lb

## 2024-04-10 DIAGNOSIS — R351 Nocturia: Secondary | ICD-10-CM | POA: Insufficient documentation

## 2024-04-10 DIAGNOSIS — Z9989 Dependence on other enabling machines and devices: Secondary | ICD-10-CM | POA: Diagnosis not present

## 2024-04-10 DIAGNOSIS — G4701 Insomnia due to medical condition: Secondary | ICD-10-CM | POA: Insufficient documentation

## 2024-04-10 DIAGNOSIS — Z9189 Other specified personal risk factors, not elsewhere classified: Secondary | ICD-10-CM | POA: Diagnosis not present

## 2024-04-10 DIAGNOSIS — G8929 Other chronic pain: Secondary | ICD-10-CM | POA: Diagnosis not present

## 2024-04-10 DIAGNOSIS — G894 Chronic pain syndrome: Secondary | ICD-10-CM | POA: Insufficient documentation

## 2024-04-10 DIAGNOSIS — E662 Morbid (severe) obesity with alveolar hypoventilation: Secondary | ICD-10-CM | POA: Diagnosis not present

## 2024-04-10 MED ORDER — ALPRAZOLAM 0.5 MG PO TABS: 0.5000 mg | ORAL_TABLET | Freq: Every evening | ORAL | 0 refills | Status: AC | PRN

## 2024-04-10 NOTE — Progress Notes (Signed)
 "  @[facility]   Provider:  Dedra Gores, MD  Primary Care Physician:  Trudy Vaughn FALCON, MD [address] [place] [address]    Referring Provider: Delores Clayborne LABOR, [place] [address] [place],  [place] [address]        Chief Concern for this Consultation:   Patient presents with          HPI: Branch have the pleasure of meeting with Douglas Branch , on 04/10/24 , who is a 49 y.o.  male patient,  seen upon a referral by [facility]  for a  Sleep Medicine Consultation.    The patient's referral information asked for a new evaluation . Of apnea, given a histiyr of non restorative sleep, daytime sleepiness and some break through snoring and frequent nocturia.  Last sleep study  over 5 years ago.  Chief concern according to patient:  He  presented with a medical history of    has a past medical history of ADD (attention deficit disorder), Allergy, Arthritis, Back pain, Constipation, DDD (degenerative disc disease), lumbar, Fatty liver, GERD (gastroesophageal reflux disease), Joint pain, Lactose intolerance, Osteoarthritis, Palpitations, PONV (postoperative nausea and vomiting), Pre-diabetes, and Sleep apnea.. Sleep relevant medical/ surgical and symptom history: The patient reports onset of symptoms over a time period of over 3 years , may be 5 CPAP dependent  .  See ROS. Walker used for about 2 years, stage 4 osteoarthritis in both hips.   ENT surgery in adulthood: Tonsillectomy),  head injury as a young child,  Chronic liver disease and platelets.  Low platelets, , alcohol abuse, GERD,  pre-diabetic endocrinological disorders, Substance Abuse,  Mood disorders,marihuana user.  This patient had a previous sleep study/ studies  5-15 years ago at  with a resulting diagnosis of  OSA .  Was sevre when he as 500 pounds, OHV.  This patient has used the following therapies: CPAP     Family medical history: There are  biological family members affected by Sleep apnea ( parents,  grandparents cousins, ),    Social history: Pt is disabled from driving -. He lives in a private home, in a household with kids- Nicotine use: marihuana.  ETOH use: previously ,  Caffeine intake in form of: Coffee ( 8 ounce cups- 4 ), Soft drinks (/), Tea ( green ) or Energy drinks ( including those containing  taurine ). Caffeine is last consumed at 3 PM.  Exercises regularly  upper body-     Sleep habits and routines are as follows: The patient's dinner time is around 6 PM.  The patient goes to bed at, or close to, 9-10 PM.  He is CPAP dependent/  The bedroom  is described as cool, quiet, and dark. The patient reports that it takes 3--40  minutes to fall asleep, then continues to sleep for 5- hours, interrupted or woken up by pain, by the need to void (Nocturia).4-5 times,   The preferred sleep position is  left , with elevated feet , support of 1-2 pillows, (non- adjustable bed/ recliner ).  The total estimated sleep time is circa 5 hours.  Dreams are reportedly rare Dream enactment has not been reported.   7 AM is the usual week- day rise time.  The patient wakes up spontaneously. He  reports not feeling refreshed and restored in the morning, waking with symptoms such as dry mouth, pain, morning headache ( before CPAP ) s, stiffness or pain, and fatigue.  No sleep paralysis has been experienced.  Naps in daytime are taken infrequently (there is a desire to nap and opportunity), lasting from 45 to 60 and have a refreshing quality. These do not interfere with nocturnal sleep.    Review of Systems: Out of a complete 14 system review, the patient complains of only the following symptoms, and all other reviewed systems are negative.:  Hypersomnia : ESS 10/ FSS 22  PAIN   Snoring, Sleep fragmentation, Nocturia   How likely are you to doze in the following situations: 0 = not likely, 1 = slight chance, 2 = moderate chance, 3 = high chance Sitting and Reading? Watching Television? Sitting  inactive in a public place (theater or meeting)? As a passenger in a car for an hour without a break? Lying down in the afternoon when circumstances permit? Sitting and talking to someone? Sitting quietly after lunch without alcohol? In a car, while stopped for a few minutes in traffic?   Total ESS =10 / 24 points.    FSS endorsed at 22/ 63 points.  GDS:  Social History   Socioeconomic History   Marital status: Divorced    Spouse name: Not on file   Number of children: 3   Years of education: Not on file   Highest education level: Not on file  Occupational History   Not on file  Tobacco Use   Smoking status: Former   Smokeless tobacco: Never  Vaping Use   Vaping status: Never Used  Substance and Sexual Activity   Alcohol use: No    Comment: None since 2022.   Drug use: Yes    Types: Marijuana   Sexual activity: Not on file  Other Topics Concern   Not on file  Social History Narrative   Lives with 4 children.    Social Drivers of Health   Tobacco Use: Medium Risk (04/10/2024)   Patient History    Smoking Tobacco Use: Former    Smokeless Tobacco Use: Never    Passive Exposure: Not on file  Financial Resource Strain: Low Risk (04/07/2021)   Received from [facility]   Overall Financial Resource Strain (CARDIA)    Difficulty of Paying Living Expenses: Not hard at all  Food Insecurity: No Food Insecurity (04/07/2021)   Received from [facility]   Epic    Within the past 12 months, you worried that your food would run out before you got the money to buy more.: Never true    Within the past 12 months, the food you bought just didn't last and you didn't have money to get more.: Never true  Transportation Needs: No Transportation Needs (04/07/2021)   Received from [facility]   PRAPARE - Transportation    Lack of Transportation (Medical): No    Lack of Transportation (Non-Medical): No  Physical Activity: Inactive (04/07/2021)   Received from [facility]    Exercise Vital Sign    On average, how many days per week do you engage in moderate to strenuous exercise (like a brisk walk)?: 0 days    On average, how many minutes do you engage in exercise at this level?: 0 min  Stress: No Stress Concern Present (04/07/2021)   Received from [facility] - Occupational Stress Questionnaire    Feeling of Stress : Not at all  Social Connections: Socially Isolated (04/07/2021)   Received from [facility]   Social Connection and Isolation  Panel    In a typical week, how many times do you talk on the phone with family, friends, or neighbors?: More than three times a week    How often do you get together with friends or relatives?: Twice a week    How often do you attend church or religious services?: Never    Do you belong to any clubs or organizations such as church groups, unions, fraternal or athletic groups, or school groups?: No    How often do you attend meetings of the clubs or organizations you belong to?: Never    Are you married, widowed, divorced, separated, never married, or living with a partner?: Never married  Depression (PHQ2-9): Not on file  Alcohol Screen: Not on file  Housing: Not on file  Utilities: Not on file  Health Literacy: Low Risk (04/07/2021)   Received from [facility]   Health Literacy    How often do you need to have someone help you when you read instructions, pamphlets, or other written material from your doctor or pharmacy?: Never    Family History  Problem Relation Age of Onset   Hyperlipidemia Mother    Hypertension Mother    Diabetes Mother    Heart attack Mother 70       CABG   Anxiety disorder Mother    Sleep apnea Mother    Hypertension Father    Hyperlipidemia Father    Diabetes Father    Depression Father    Anxiety disorder Father    Sleep apnea Father    Eating disorder Father    Obesity Father    Diabetes Maternal Grandmother    Hyperlipidemia  Maternal Grandmother    Hypertension Maternal Grandmother    Diabetes Maternal Grandfather    Hyperlipidemia Maternal Grandfather    Hypertension Maternal Grandfather    Diabetes Paternal Grandmother    Hyperlipidemia Paternal Grandmother    Emphysema Paternal Grandmother    Hypertension Paternal Grandmother    Diabetes Paternal Grandfather    Hyperlipidemia Paternal Grandfather    Hypertension Paternal Grandfather    Gout Paternal Uncle     Past Medical History:  Diagnosis Date   ADD (attention deficit disorder)    Allergy    seasonal    Arthritis    hips, knee   Back pain    Constipation    DDD (degenerative disc disease), lumbar    Fatty liver    GERD (gastroesophageal reflux disease)    Joint pain    Lactose intolerance    Osteoarthritis    Palpitations    PONV (postoperative nausea and vomiting)    Pre-diabetes    Sleep apnea     Past Surgical History:  Procedure Laterality Date   COLONOSCOPY WITH PROPOFOL  N/A 11/26/2022   Procedure: COLONOSCOPY WITH PROPOFOL ;  Surgeon: Cindie Carlin POUR, DO;  [facility];  Service: Endoscopy;  Laterality: N/A;  9:45 am, asa 3, pt knows to arrive at 6:30   ESOPHAGOGASTRODUODENOSCOPY (EGD) WITH PROPOFOL  N/A 11/26/2022   Procedure: ESOPHAGOGASTRODUODENOSCOPY (EGD) WITH PROPOFOL ;  Surgeon: Cindie Carlin POUR, DO;  [facility];  Service: Endoscopy;  Laterality: N/A;   HEMOSTASIS CLIP PLACEMENT  11/26/2022   Procedure: HEMOSTASIS CLIP PLACEMENT;  Surgeon: Cindie Carlin POUR, DO;  [facility];  Service: Endoscopy;;   POLYPECTOMY  11/26/2022   Procedure: POLYPECTOMY;  Surgeon: Cindie Carlin POUR, DO;  [facility];  Service: Endoscopy;;   ROTATOR CUFF REPAIR Left 2010  TONSILLECTOMY     WISDOM TOOTH EXTRACTION       Medications Ordered Prior to Encounter[1]  Allergies[2]  Vitals:   04/10/24 1109  BP: (!) 143/86  Pulse: 89    Physical exam:   General: The patient was alert and  appears not in acute distress.  Mood and affect are appropriate .  The patient's interactions are: Cooperative, makes eye contact, follows the instructions and answers questions coherently.  The patient is groomed and appropriately groomed and dressed. Head: Normocephalic, atraumatic.  Neck is supple. Mallampati: 3 plus .  The neck circumference measured 20 inches. Nasal airflow was not patent ,   Overbite / Retrognathia was noted.  Dental status: crowded, regular  Cardiovascular:  Regular rate and cardiac rhythm by palpable pulse. Respiratory: no audible wheezing, no tachypnoea.   Skin:  Without evidence of ankle edema. No discoloration.  Trunk:  BMI is  51 The patient's posture was erect.   Neurologic exam : The patient was awake and alert, oriented to place and time.   Attention span & concentration ability appeared normal.  Speech was fluent, without dysarthria, dysphonia or aphasia, and of normal volume.     Cranial nerves:  There was no loss of smell or taste reported  Pupils are round, equal in size and briskly reactive to light.  Funduscopic exam was deferred.  Extraocular movements in vertical and horizontal planes were intact and without nystagmus. (No Diplopia reported). Visual fields by finger perimetry are intact. Hearing was intact to soft voice.    Facial sensation intact to fine touch.  Facial motor strength: Symmetric movement and tongue and uvula move midline.  Neck ROM: rotation, tilt and flexion extension were intact for age and shoulder shrug was symmetrical.    Motor exam:  Symmetric grip strength.   Sensory:  Fine touch and vibration were intact.  Proprioception tested in the upper extremities was normal.   Coordination: The patient reported no problems with button closure and no changes to penmanship.   The Finger-to-nose maneuver was intact without evidence of ataxia, dysmetria or tremor.   Gait and station: walker    Deep tendon reflexes: deferred     Branch would like to thank Trudy Vaughn FALCON, MD and Delores Clayborne LABOR, [place] [address] [place],  [place] [address] for allowing me to meet with Douglas Dicola  Branch who is presenting with    Chronic pain affecting sleep and mood.   HIgh risk for obesity hypoventilation   Nasal congestion, CPAP dependence.   NOCTURIA - 4-5 times !  Risk factors for OSA were present,  including : Body mass index is 50.79 kg/m., neck size  20  and upper airway anatomy.  SOB (shortness of breath) on exertion  Other cirrhosis of liver (HCC), fatty liver   Primary osteoarthritis of both hips  Elevated blood pressure reading  Vitamin D  deficiency  OSA (obstructive sleep apnea) on CPAP with persistent fatigue/ hypersomnia   Class 3 severe obesity due to excess calories with serious comorbidity and body mass index (BMI) of 50 plus in adult     My Plan is to proceed with:  HST/ SPLIT/  1) HST without CPAP for baseline, he is CPAP dependent and it would be hard to sleep for him.  Alternatively, a in lab study allows more differentiation form OHV,  central apnea etc, it will help h us  to explain more about the nocturia ,too. He was tested for UTI and that was negative.  Branch plan to follow up personally or /through our NP within 4-6 months.   A total time of  50  minutes consistent of a part of face to face encounter , exam and interview,  and additional preparation time for chart review was spent .  At today's visit, we discussed treatment options, associated risk and benefits, and engage in counseling as needed including, but not limited to:  Sleep hygiene, Quality Sleep Habits, and Safety concerns for patients with daytime sleepiness who are warned to not operate machinery/ motor vehicles when drowsy. Risk factors for sleep apnea were identified:  Body mass index is 50.79 kg/m..  Additionally, the following were reviewed: Past medical records, past medical and surgical history, family and social  background, as well as relevant laboratory results, imaging findings, and medical notes, where applicable.  This note was generated by myself in part by using dictation software, and as a result, it may contain unintentional typos and errors.  Nevertheless, effort was made to accurately convey the pertinent aspects of the patient's visit.   Dedra Gores, MD  [facility] Sleep Board certified in Sleep Medicine by [employer] of Sleep Medicine and Diplomate of the [employer] (AASM) . Board certified In Neurology, Diplomat of the ABPN,  Fellow of the [employer] of Neu[employer].       Addendum :         [1]  Current Outpatient Medications on File Prior to Visit  Medication Sig Dispense Refill   BIOTIN PO Take by mouth.     L-ARGININE PO Take by mouth.     MAGNESIUM COMPLEX PO Take by mouth.     methocarbamol (ROBAXIN) 500 MG tablet Take by mouth.     MORPHINE  SULFATE, CONCENTRATE, PO Take by mouth.     Multiple Vitamin (MULTIVITAMIN WITH MINERALS) TABS tablet Take 1 tablet by mouth daily.     oxyCODONE (OXY IR/ROXICODONE) 5 MG immediate release tablet Take 5 mg by mouth every 4 (four) hours as needed for severe pain.     UNABLE TO FIND Take 10,000 mg by mouth daily. Med Name: Marijuana     Vitamin D , Ergocalciferol , (DRISDOL ) 1.25 MG (50000 UNIT) CAPS capsule Take 1 capsule (50,000 Units total) by mouth every 7 (seven) days. 12 capsule 0   No current facility-administered medications on file prior to visit.  [2]  Allergies Allergen Reactions   Pollen Extract Itching    Watery eyes   "

## 2024-04-10 NOTE — Patient Instructions (Signed)
 I would like to thank Trudy Vaughn FALCON, MD and Delores Clayborne LABOR, Do [address] [place],  [place] [address] for allowing me to meet with Douglas Branch  I who is presenting with      Chronic pain affecting sleep and mood.    HIgh risk for obesity hypoventilation   Nasal congestion, CPAP dependence.    NOCTURIA - 4-5 times !  Risk factors for OSA were present,  including : Body mass index is 50.79 kg/m., neck size  20  and upper airway anatomy.   SOB (shortness of breath) on exertion   Other cirrhosis of liver (HCC), fatty liver    Primary osteoarthritis of both hips   Elevated blood pressure reading   Vitamin D  deficiency   OSA (obstructive sleep apnea) on CPAP with persistent fatigue/ hypersomnia    Class 3 severe obesity due to excess calories with serious comorbidity and body mass index (BMI) of 50 plus in adult       My Plan is to proceed with:   HST/ SPLIT/   1) HST without CPAP for baseline, he is CPAP dependent and it would be hard to sleep for him.  Alternatively, a in lab study allows more differentiation form OHV,  central apnea etc, it will help h us  to explain more about the nocturia ,too. He was tested for UTI and that was negative.      I plan to follow up personally or /through our NP within 4-6 months.    A total time of  50  minutes consistent of a part of face to face encounter , exam and interview,  and additional preparation time for chart review was spent .  At today's visit, we discussed treatment options, associated risk and benefits, and engage in counseling as needed including, but not limited to:  Sleep hygiene, Quality Sleep Habits, and Safety concerns for patients with daytime sleepiness who are warned to not operate machinery/ motor vehicles when drowsy. Risk factors for sleep apnea were identified:  Body mass index is 50.79 kg/m..  Additionally, the following were reviewed: Past medical records, past medical and surgical history, family and  social background, as well as relevant laboratory results, imaging findings, and medical notes, where applicable.  This note was generated by myself in part by using dictation software, and as a result, it may contain unintentional typos and errors.  Nevertheless, effort was made to accurately convey the pertinent aspects of the patient's visit.    Dedra Gores, MD  [facility] and [facility] Sleep Board certified in Sleep Medicine by [employer] and Diplomate of the [employer] ([employer]ASM) . Board certified In Neurology, Diplomat of the ABPN,  Fellow of the [employer].

## 2024-04-14 ENCOUNTER — Encounter (INDEPENDENT_AMBULATORY_CARE_PROVIDER_SITE_OTHER): Payer: Self-pay | Admitting: Bariatrics

## 2024-04-14 DIAGNOSIS — G4733 Obstructive sleep apnea (adult) (pediatric): Secondary | ICD-10-CM | POA: Insufficient documentation

## 2024-04-15 NOTE — Progress Notes (Unsigned)
 "  GI Office Note    Referring Provider: Trudy Vaughn FALCON, MD Primary Care Physician:  Trudy Vaughn FALCON, MD Primary Gastroenterologist: Carlin POUR. Cindie, DO  Date:  04/15/2024  ID:  Douglas Branch, DOB 01-Jun-1975, MRN [id number]   Chief Complaint   No chief complaint on file.  History of Present Illness  Douglas Branch is a 49 y.o. 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  Colonoscopy 11/26/2022: - No external hemorrhoids - non bleeding internal hemorrhoids - two 4-6 mm polyps resected and retrieved - one 12 mm polyp resected and retrieved s/p MR conditional clipping.   - Recommended 3-year surveillance. - Pathology with tubular adenomas.   EGD 11/26/2022:  - Gastritis, otherwise normal exam.   - Recommended repeat in 3 years for screening purposes.  Last office visit April 2025 with [facility], PA-C.  He noticed slight peripheral edema in his ankles at times.  Was not on any diuretics and never had a paracentesis or SBP or encephalopathy.  Was also not on a NS BB.  Reportedly used to weigh over 400 pounds but had changed diet and lost weight intentionally over the years and denied any alcohol intake since 2022.  Reported cramping with urge to defecate since he was a child.  Is on chronic oxycodone which we will suspect causes constipation.  Was taking fiber and eating prunes.  Denied any melena or BRBPR.  MELD labs ordered along with AFP, RUQ US .  Recommended hepatitis A and B vaccination.  Cirrhosis diet recommendations given.  Advise EGD repeat in 2027.  Given samples of Linzess 145 mcg to trial for constipation.    Today:  Labs - August 2025  -normal LFTs, thrombocytopenia with platelets 119.  A1c 5.4. MELD 3.0: Unable to calculate, no recent labs or INR. US : April 2025 -CBD 3 mm, no evidence of gallstones.  No focal liver lesion.  Increased echotexture of the liver. AFP: Needs updating *** Hep A/B vaccination: ***Needs to complete EGD: September 2024 without evidence of varices, due for screening 11/2025 BB: ***Not on therapy. Ascites/peripheral edema:  Diuretics: None*** Paracentesis: None History of SBP: *** Encephalopathy:  ***    Wt Readings from Last 6 Encounters:  04/10/24 (!) 354 lb (160.6 kg)  01/28/24 (!) 339 lb (153.8 kg)  01/13/24 (!) 335 lb (152 kg)  11/27/23 (!) 338 lb (153.3 kg)  11/05/23 (!) 329 lb (149.2 kg)  06/27/23 288 lb (130.6 kg)    There is no height or weight on file to calculate BMI.   Current Outpatient Medications  Medication Sig Dispense Refill   ALPRAZolam  (XANAX ) 0.5 MG tablet Take 1 tablet (0.5 mg total) by mouth at bedtime as needed (pre medication for sleep study). 2 tablet 0   BIOTIN PO Take by mouth.     L-ARGININE PO Take by mouth.     MAGNESIUM COMPLEX PO Take by mouth.     methocarbamol (ROBAXIN) 500 MG tablet Take by mouth.     MORPHINE  SULFATE, CONCENTRATE, PO Take by mouth.     Multiple Vitamin (MULTIVITAMIN WITH MINERALS) TABS tablet Take 1 tablet by mouth daily.     oxyCODONE (OXY IR/ROXICODONE) 5 MG immediate release tablet Take 5 mg by mouth every 4 (four) hours as needed  for severe pain.     UNABLE TO FIND Take 10,000 mg by mouth daily. Med Name: Marijuana     Vitamin D , Ergocalciferol , (DRISDOL ) 1.25 MG (50000 UNIT) CAPS capsule Take 1 capsule (50,000 Units total) by mouth every 7 (seven) days. 12 capsule 0   No current facility-administered medications for this visit.    Past Medical History:  Diagnosis Date   ADD (attention deficit disorder)    Allergy    seasonal    Arthritis    hips, knee   Back pain    Constipation    DDD (degenerative disc disease),  lumbar    Fatty liver    GERD (gastroesophageal reflux disease)    Joint pain    Lactose intolerance    Osteoarthritis    Palpitations    PONV (postoperative nausea and vomiting)    Pre-diabetes    Sleep apnea     Past Surgical History:  Procedure Laterality Date   COLONOSCOPY WITH PROPOFOL  N/A 11/26/2022   Procedure: COLONOSCOPY WITH PROPOFOL ;  Surgeon: Cindie Carlin POUR, DO;  [facility];  Service: Endoscopy;  Laterality: N/A;  9:45 am, asa 3, pt knows to arrive at 6:30   ESOPHAGOGASTRODUODENOSCOPY (EGD) WITH PROPOFOL  N/A 11/26/2022   Procedure: ESOPHAGOGASTRODUODENOSCOPY (EGD) WITH PROPOFOL ;  Surgeon: Cindie Carlin POUR, DO;  Location: [facility];  Service: Endoscopy;  Laterality: N/A;   HEMOSTASIS CLIP PLACEMENT  11/26/2022   Procedure: HEMOSTASIS CLIP PLACEMENT;  Surgeon: Cindie Carlin POUR, DO;  Location: [facility];  Service: Endoscopy;;   POLYPECTOMY  11/26/2022   Procedure: POLYPECTOMY;  Surgeon: Cindie Carlin POUR, DO;  [facility]: [facility];  Service: Endoscopy;;   ROTATOR CUFF REPAIR Left 2010   TONSILLECTOMY     WISDOM TOOTH EXTRACTION      Family History  Problem Relation Age of Onset   Hyperlipidemia Mother    Hypertension Mother    Diabetes Mother    Heart attack Mother 53       CABG   Anxiety disorder Mother    Sleep apnea Mother    Hypertension Father    Hyperlipidemia Father    Diabetes Father    Depression Father    Anxiety disorder Father    Sleep apnea Father    Eating disorder Father    Obesity Father    Diabetes Maternal Grandmother    Hyperlipidemia Maternal Grandmother    Hypertension Maternal Grandmother    Diabetes Maternal Grandfather    Hyperlipidemia Maternal Grandfather    Hypertension Maternal Grandfather    Diabetes Paternal Grandmother    Hyperlipidemia Paternal Grandmother    Emphysema Paternal Grandmother    Hypertension Paternal Grandmother    Diabetes Paternal Grandfather    Hyperlipidemia Paternal Grandfather     Hypertension Paternal Grandfather    Gout Paternal Uncle     Allergies as of 04/16/2024 - Review Complete 04/10/2024  Allergen Reaction Noted   Pollen extract Itching 11/20/2017    Social History   Socioeconomic History   Marital status: Divorced    Spouse name: Not on file   Number of children: 3   Years of education: Not on file   Highest education level: Not on file  Occupational History   Not on file  Tobacco Use   Smoking status: Former   Smokeless tobacco: Never  Vaping Use   Vaping status: Never Used  Substance and Sexual Activity   Alcohol use: No    Comment: None since 2022.   Drug  use: Yes    Types: Marijuana   Sexual activity: Not on file  Other Topics Concern   Not on file  Social History Narrative   Lives with 4 children.    Social Drivers of Health   Tobacco Use: Medium Risk (04/10/2024)   Patient History    Smoking Tobacco Use: Former    Smokeless Tobacco Use: Never    Passive Exposure: Not on Actuary Strain: Not on file  Food Insecurity: Not on file  Transportation Needs: Not on file  Physical Activity: Not on file  Stress: Not on file  Social Connections: Not on file  Depression (EYV7-0): Not on file  Alcohol Screen: Not on file  Housing: Not on file  Utilities: Not on file  Health Literacy: Not on file    Review of Systems   Gen: Denies fever, chills, anorexia. Denies fatigue, weakness, weight loss.  CV: Denies chest pain, palpitations, syncope, peripheral edema, and claudication. Resp: Denies dyspnea at rest, cough, wheezing, coughing up blood, and pleurisy. GI: See HPI Derm: Denies rash, itching, dry skin Psych: Denies depression, anxiety, memory loss, confusion. No homicidal or suicidal ideation.  Heme: Denies bruising, bleeding, and enlarged lymph nodes.  Physical Exam   There were no vitals taken for this visit.  General:   Alert and oriented. No distress noted. Pleasant and cooperative.  Head:   Normocephalic and atraumatic. Eyes:  Conjuctiva clear without scleral icterus. Mouth:  Oral mucosa pink and moist. Good dentition. No lesions. Lungs:  Clear to auscultation bilaterally. No wheezes, rales, or rhonchi. No distress.  Heart:  S1, S2 present without murmurs appreciated.  Abdomen:  +BS, soft, non-tender and non-distended. No rebound or guarding. No HSM or masses noted. Rectal: *** Msk:  Symmetrical without gross deformities. Normal posture. Extremities:  Without edema. Neurologic:  Alert and  oriented x4 Psych:  Alert and cooperative. Normal mood and affect.  Assessment & Plan  Oumar Marcott is a 49 y.o. male presenting today with ***   Follow up   Follow up ***    Charmaine Melia, MSN, FNP-BC, AGACNP-BC [facility] "

## 2024-04-16 ENCOUNTER — Ambulatory Visit: Admitting: Gastroenterology

## 2024-05-28 ENCOUNTER — Ambulatory Visit: Admitting: Gastroenterology

## 2024-07-17 ENCOUNTER — Ambulatory Visit: Admitting: Urology
